# Patient Record
Sex: Female | Born: 1990 | Race: Black or African American | Hispanic: No | Smoking: Never smoker
Health system: Southern US, Community
[De-identification: ages and names within clinical notes are randomized; demographics above are authoritative.]

## PROBLEM LIST (undated history)

## (undated) DIAGNOSIS — D649 Anemia, unspecified: Secondary | ICD-10-CM

## (undated) DIAGNOSIS — F1410 Cocaine abuse, uncomplicated: Secondary | ICD-10-CM

---

## 2013-11-18 ENCOUNTER — Emergency Department: Payer: Self-pay | Admitting: Emergency Medicine

## 2013-11-18 LAB — URINALYSIS, COMPLETE
BILIRUBIN, UR: NEGATIVE
Bacteria: NONE SEEN
Glucose,UR: NEGATIVE mg/dL (ref 0–75)
KETONE: NEGATIVE
Nitrite: NEGATIVE
PH: 6 (ref 4.5–8.0)
Protein: NEGATIVE
RBC,UR: 4 /HPF (ref 0–5)
Specific Gravity: 1.027 (ref 1.003–1.030)
Squamous Epithelial: 3

## 2014-01-14 ENCOUNTER — Emergency Department (HOSPITAL_COMMUNITY)
Admission: EM | Admit: 2014-01-14 | Discharge: 2014-01-14 | Disposition: A | Discharge status: Home / Self Care | Payer: Self-pay | Attending: Emergency Medicine | Admitting: Emergency Medicine

## 2014-01-14 ENCOUNTER — Encounter (HOSPITAL_COMMUNITY): Payer: Self-pay | Admitting: Emergency Medicine

## 2014-01-14 DIAGNOSIS — Z79899 Other long term (current) drug therapy: Secondary | ICD-10-CM | POA: Insufficient documentation

## 2014-01-14 DIAGNOSIS — R1013 Epigastric pain: Secondary | ICD-10-CM | POA: Insufficient documentation

## 2014-01-14 DIAGNOSIS — Z3202 Encounter for pregnancy test, result negative: Secondary | ICD-10-CM | POA: Insufficient documentation

## 2014-01-14 DIAGNOSIS — R112 Nausea with vomiting, unspecified: Secondary | ICD-10-CM | POA: Insufficient documentation

## 2014-01-14 LAB — URINALYSIS, ROUTINE W REFLEX MICROSCOPIC
Bilirubin Urine: NEGATIVE
GLUCOSE, UA: NEGATIVE mg/dL
Hgb urine dipstick: NEGATIVE
Ketones, ur: NEGATIVE mg/dL
Leukocytes, UA: NEGATIVE
Nitrite: NEGATIVE
PH: 8.5 — AB (ref 5.0–8.0)
Protein, ur: NEGATIVE mg/dL
SPECIFIC GRAVITY, URINE: 1.023 (ref 1.005–1.030)
Urobilinogen, UA: 1 mg/dL (ref 0.0–1.0)

## 2014-01-14 LAB — COMPREHENSIVE METABOLIC PANEL
ALBUMIN: 3.6 g/dL (ref 3.5–5.2)
ALT: 10 U/L (ref 0–35)
AST: 19 U/L (ref 0–37)
Alkaline Phosphatase: 81 U/L (ref 39–117)
BILIRUBIN TOTAL: 0.4 mg/dL (ref 0.3–1.2)
BUN: 10 mg/dL (ref 6–23)
CHLORIDE: 102 meq/L (ref 96–112)
CO2: 25 mEq/L (ref 19–32)
CREATININE: 0.82 mg/dL (ref 0.50–1.10)
Calcium: 9.4 mg/dL (ref 8.4–10.5)
GFR calc Af Amer: >90 mL/min (ref >90)
GFR calc non Af Amer: >90 mL/min (ref >90)
Glucose, Bld: 81 mg/dL (ref 70–99)
Potassium: 3.9 mEq/L (ref 3.7–5.3)
Sodium: 140 mEq/L (ref 137–147)
TOTAL PROTEIN: 7.7 g/dL (ref 6.0–8.3)

## 2014-01-14 LAB — CBC WITH DIFFERENTIAL/PLATELET
BASOS ABS: 0.1 10*3/uL (ref 0.0–0.1)
BASOS PCT: 1 % (ref 0–1)
Eosinophils Absolute: 0.7 10*3/uL (ref 0.0–0.7)
Eosinophils Relative: 9 % — ABNORMAL HIGH (ref 0–5)
HEMATOCRIT: 38.7 % (ref 36.0–46.0)
Hemoglobin: 13.9 g/dL (ref 12.0–15.0)
Lymphocytes Relative: 44 % (ref 12–46)
Lymphs Abs: 3.6 10*3/uL (ref 0.7–4.0)
MCH: 28.4 pg (ref 26.0–34.0)
MCHC: 35.9 g/dL (ref 30.0–36.0)
MCV: 79 fL (ref 78.0–100.0)
MONO ABS: 0.6 10*3/uL (ref 0.1–1.0)
Monocytes Relative: 8 % (ref 3–12)
NEUTROS PCT: 38 % — AB (ref 43–77)
Neutro Abs: 3.1 10*3/uL (ref 1.7–7.7)
Platelets: 319 10*3/uL (ref 150–400)
RBC: 4.9 MIL/uL (ref 3.87–5.11)
RDW: 13.9 % (ref 11.5–15.5)
WBC: 8.1 10*3/uL (ref 4.0–10.5)

## 2014-01-14 LAB — LIPASE, BLOOD: Lipase: 36 U/L (ref 11–59)

## 2014-01-14 LAB — POC URINE PREG, ED: PREG TEST UR: NEGATIVE

## 2014-01-14 MED ORDER — HYDROCODONE-ACETAMINOPHEN 5-325 MG PO TABS: 1.0000 | ORAL_TABLET | Freq: Four times a day (QID) | ORAL | Status: DC | PRN

## 2014-01-14 MED ORDER — ONDANSETRON HCL 4 MG PO TABS: 4.0000 mg | ORAL_TABLET | Freq: Four times a day (QID) | ORAL | Status: DC

## 2014-01-14 MED ORDER — OMEPRAZOLE 20 MG PO CPDR: 20.0000 mg | DELAYED_RELEASE_CAPSULE | Freq: Every day | ORAL | Status: DC

## 2014-01-14 MED ORDER — GI COCKTAIL ~~LOC~~: 30.0000 mL | Freq: Once | ORAL | Status: DC

## 2014-01-14 NOTE — ED Provider Notes (Signed)
Medical screening examination/treatment/procedure(s) were performed by non-physician practitioner and as supervising physician I was immediately available for consultation/collaboration.   EKG Interpretation None        Melanie Belfi, MD 01/14/14 2347 

## 2014-01-14 NOTE — ED Provider Notes (Signed)
CSN: [id number]     Arrival date & time 01/14/14  1728 History   First MD Initiated Contact with Patient 01/14/14 1919     Chief Complaint  Patient presents with  . Abdominal Pain    (Consider location/radiation/quality/duration/timing/severity/associated sxs/prior Treatment) HPI Comments: Patient denies history of abdominal surgeries  Patient is a 23 y.o. female presenting with abdominal pain. The history is provided by the patient. No language interpreter was used.  Abdominal Pain Pain location:  Epigastric Pain quality: cramping, pressure and sharp   Pain radiates to:  Does not radiate Pain severity:  Moderate Onset quality:  Gradual Duration:  1 week Timing:  Constant Progression:  Waxing and waning Chronicity:  New Context: not recent illness, not recent travel, not sick contacts and not suspicious food intake   Relieved by:  None tried Worsened by:  Eating Ineffective treatments:  None tried Associated symptoms: nausea and vomiting (x 1 two days ago)   Associated symptoms: no chest pain, no constipation, no diarrhea, no dysuria, no fever, no hematemesis, no hematochezia, no hematuria, no melena and no shortness of breath   Associated symptoms comment:  +sour taste in back of throat Risk factors: has not had multiple surgeries and no recent hospitalization     History reviewed. No pertinent past medical history. History reviewed. No pertinent past surgical history. History reviewed. No pertinent family history. History  Substance Use Topics  . Smoking status: Never Smoker   . Smokeless tobacco: Not on file  . Alcohol Use: Yes     Comment: occ   OB History   Grav Para Term Preterm Abortions TAB SAB Ect Mult Living                  Review of Systems  Constitutional: Negative for fever.  Respiratory: Negative for shortness of breath.   Cardiovascular: Negative for chest pain.  Gastrointestinal: Positive for nausea, vomiting (x 1 two days ago) and abdominal pain.  Negative for diarrhea, constipation, melena, hematochezia and hematemesis.  Genitourinary: Negative for dysuria and hematuria.  All other systems reviewed and are negative.    Allergies  Peanut butter flavor and Soy allergy  Home Medications   Prior to Admission medications   Medication Sig Start Date End Date Taking? Authorizing Kenzly Rogoff  ibuprofen (ADVIL,MOTRIN) 200 MG tablet Take 400 mg by mouth every 6 (six) hours as needed.   Yes Historical Cataleyah Colborn, MD  HYDROcodone-acetaminophen (NORCO/VICODIN) 5-325 MG per tablet Take 1 tablet by mouth every 6 (six) hours as needed for moderate pain or severe pain. 01/14/14   Antony MaduraKelly Humes, PA-C  omeprazole (PRILOSEC) 20 MG capsule Take 1 capsule (20 mg total) by mouth daily. 01/14/14   Antony MaduraKelly Humes, PA-C  ondansetron (ZOFRAN) 4 MG tablet Take 1 tablet (4 mg total) by mouth every 6 (six) hours. 01/14/14   Antony MaduraKelly Humes, PA-C   BP 104/65  Pulse 72  Temp(Src) 98.2 F (36.8 C) (Oral)  Resp 17  Ht 5' 2.25" (1.581 m)  Wt 135 lb (61.236 kg)  BMI 24.50 kg/m2  SpO2 100%  LMP 01/08/2014  Physical Exam  Nursing note and vitals reviewed. Constitutional: She is oriented to person, place, and time. She appears well-developed and well-nourished. No distress.  Nontoxic/nonseptic appearing  HENT:  Head: Normocephalic and atraumatic.  Mouth/Throat: Oropharynx is clear and moist. No oropharyngeal exudate.  Eyes: Conjunctivae and EOM are normal. No scleral icterus.  Neck: Normal range of motion.  Cardiovascular: Normal rate, regular rhythm and normal heart sounds.  Pulmonary/Chest: Effort normal and breath sounds normal. No respiratory distress. She has no wheezes. She has no rales.  Chest expansion symmetric  Abdominal: Soft. She exhibits no distension and no mass. There is tenderness (epigastric). There is no rebound and no guarding.  Negative Murphy sign. No peritoneal signs.  Musculoskeletal: Normal range of motion.  Neurological: She is alert and  oriented to person, place, and time. Coordination normal.  GCS 15. Patient moves extremities without ataxia.  Skin: Skin is warm and dry. No rash noted. She is not diaphoretic. No erythema. No pallor.  Psychiatric: She has a normal mood and affect. Her behavior is normal.    ED Course  Procedures (including critical care time) Labs Review Labs Reviewed  CBC WITH DIFFERENTIAL - Abnormal; Notable for the following:    Neutrophils Relative % 38 (*)    Eosinophils Relative 9 (*)    All other components within normal limits  URINALYSIS, ROUTINE W REFLEX MICROSCOPIC - Abnormal; Notable for the following:    pH 8.5 (*)    All other components within normal limits  COMPREHENSIVE METABOLIC PANEL  LIPASE, BLOOD  POC URINE PREG, ED    Imaging Review No results found.   EKG Interpretation None      MDM   Final diagnoses:  Epigastric pain    Patient is a [age] year old female with a history of reflux during pregnancy who presents to the emergency department for epigastric abdominal pain with emesis x1 two days ago. She endorses normal regular bowel movements and denies fever. Physical exam significant for a mild tenderness to deep palpation in the epigastric region with negative Murphy's sign. No peritoneal signs or abdominal distention. No masses.   Labs today reviewed which show no leukocytosis, electrolyte imbalance, or anemia. Liver and kidney function preserved. Given reassuring labs and hemodynamic stability along with relatively benign abdominal examination, do not believe further emergent workup is indicated at this time. Doubt bowel obstruction given lack of distention and normal bowel movements. Doubt acute cholecystitis given lack of fever, leukocytosis, and elevated LFTs. Lipase normal without evidence of pancreatitis.   Patient will be discharged with restriction for Prilosec and Zofran for likely reflux symptoms. Short course of Norco given for pain control as needed. Return  precautions provided and patient agreeable to plan with no unaddressed concerns. Patient discharged in good condition.   Filed Vitals:   01/14/14 1900 01/14/14 1915 01/14/14 1930 01/14/14 2000  BP: 129/84 113/56 114/56 104/65  Pulse: 89 74 74 72  Temp:    98.2 F (36.8 C)  TempSrc:    Oral  Resp: 25 19 17 17   Height:      Weight:      SpO2: 98% 98% 99% 100%      Antony MaduraKelly Humes, PA-C 01/14/14 2301

## 2014-01-14 NOTE — ED Notes (Signed)
Pt reports pain across mid abd and right side of abd x 1 week, had n/v x 1. Denies diarrhea.

## 2014-01-14 NOTE — ED Notes (Signed)
Pt unable to urinate and obtain urine sample at triage. 

## 2014-01-14 NOTE — ED Notes (Signed)
Patient refused a wheelchair.

## 2014-01-14 NOTE — Discharge Instructions (Signed)

## 2014-01-14 NOTE — ED Notes (Signed)
Tresa EndoKelly, Pa at the bedside.

## 2014-06-14 ENCOUNTER — Emergency Department (HOSPITAL_COMMUNITY)
Admission: EM | Admit: 2014-06-14 | Discharge: 2014-06-14 | Disposition: A | Discharge status: Home / Self Care | Payer: Self-pay | Attending: Emergency Medicine | Admitting: Emergency Medicine

## 2014-06-14 ENCOUNTER — Encounter (HOSPITAL_COMMUNITY): Payer: Self-pay | Admitting: Emergency Medicine

## 2014-06-14 DIAGNOSIS — B349 Viral infection, unspecified: Secondary | ICD-10-CM | POA: Insufficient documentation

## 2014-06-14 DIAGNOSIS — Z3202 Encounter for pregnancy test, result negative: Secondary | ICD-10-CM | POA: Insufficient documentation

## 2014-06-14 DIAGNOSIS — Z862 Personal history of diseases of the blood and blood-forming organs and certain disorders involving the immune mechanism: Secondary | ICD-10-CM | POA: Insufficient documentation

## 2014-06-14 DIAGNOSIS — Z79899 Other long term (current) drug therapy: Secondary | ICD-10-CM | POA: Insufficient documentation

## 2014-06-14 DIAGNOSIS — N898 Other specified noninflammatory disorders of vagina: Secondary | ICD-10-CM | POA: Insufficient documentation

## 2014-06-14 HISTORY — DX: Anemia, unspecified: D64.9

## 2014-06-14 LAB — WET PREP, GENITAL
Clue Cells Wet Prep HPF POC: NONE SEEN
Trich, Wet Prep: NONE SEEN
YEAST WET PREP: NONE SEEN

## 2014-06-14 LAB — CBC WITH DIFFERENTIAL/PLATELET
Basophils Absolute: 0 10*3/uL (ref 0.0–0.1)
Basophils Relative: 0 % (ref 0–1)
Eosinophils Absolute: 0.5 10*3/uL (ref 0.0–0.7)
Eosinophils Relative: 7 % — ABNORMAL HIGH (ref 0–5)
HEMATOCRIT: 37.5 % (ref 36.0–46.0)
Hemoglobin: 13.5 g/dL (ref 12.0–15.0)
LYMPHS ABS: 2.8 10*3/uL (ref 0.7–4.0)
LYMPHS PCT: 38 % (ref 12–46)
MCH: 28.5 pg (ref 26.0–34.0)
MCHC: 36 g/dL (ref 30.0–36.0)
MCV: 79.1 fL (ref 78.0–100.0)
MONO ABS: 0.7 10*3/uL (ref 0.1–1.0)
Monocytes Relative: 9 % (ref 3–12)
Neutro Abs: 3.5 10*3/uL (ref 1.7–7.7)
Neutrophils Relative %: 46 % (ref 43–77)
Platelets: 293 10*3/uL (ref 150–400)
RBC: 4.74 MIL/uL (ref 3.87–5.11)
RDW: 13.8 % (ref 11.5–15.5)
WBC: 7.5 10*3/uL (ref 4.0–10.5)

## 2014-06-14 LAB — COMPREHENSIVE METABOLIC PANEL
ALT: 9 U/L (ref 0–35)
AST: 14 U/L (ref 0–37)
Albumin: 3.2 g/dL — ABNORMAL LOW (ref 3.5–5.2)
Alkaline Phosphatase: 65 U/L (ref 39–117)
Anion gap: 11 (ref 5–15)
BILIRUBIN TOTAL: 0.5 mg/dL (ref 0.3–1.2)
BUN: 11 mg/dL (ref 6–23)
CALCIUM: 9.4 mg/dL (ref 8.4–10.5)
CHLORIDE: 99 meq/L (ref 96–112)
CO2: 24 meq/L (ref 19–32)
Creatinine, Ser: 0.7 mg/dL (ref 0.50–1.10)
GFR calc Af Amer: >90 mL/min (ref >90)
Glucose, Bld: 98 mg/dL (ref 70–99)
Potassium: 3.7 mEq/L (ref 3.7–5.3)
SODIUM: 134 meq/L — AB (ref 137–147)
Total Protein: 7 g/dL (ref 6.0–8.3)

## 2014-06-14 LAB — URINALYSIS, ROUTINE W REFLEX MICROSCOPIC
Bilirubin Urine: NEGATIVE
Glucose, UA: NEGATIVE mg/dL
Ketones, ur: NEGATIVE mg/dL
Leukocytes, UA: NEGATIVE
NITRITE: NEGATIVE
PROTEIN: NEGATIVE mg/dL
SPECIFIC GRAVITY, URINE: 1.011 (ref 1.005–1.030)
Urobilinogen, UA: 2 mg/dL — ABNORMAL HIGH (ref 0.0–1.0)
pH: 7 (ref 5.0–8.0)

## 2014-06-14 LAB — LIPASE, BLOOD: LIPASE: 21 U/L (ref 11–59)

## 2014-06-14 LAB — PREGNANCY, URINE: Preg Test, Ur: NEGATIVE

## 2014-06-14 LAB — URINE MICROSCOPIC-ADD ON

## 2014-06-14 MED ORDER — ONDANSETRON 4 MG PO TBDP
4.0000 mg | ORAL_TABLET | Freq: Once | ORAL | Status: DC
  Filled 2014-06-14: qty 1

## 2014-06-14 MED ORDER — BENZONATATE 100 MG PO CAPS: 100.0000 mg | ORAL_CAPSULE | Freq: Three times a day (TID) | ORAL | Status: DC

## 2014-06-14 MED ORDER — ACETAMINOPHEN 325 MG PO TABS
650.0000 mg | ORAL_TABLET | Freq: Once | ORAL | Status: AC
  Administered 2014-06-14: 650 mg via ORAL
  Filled 2014-06-14: qty 2

## 2014-06-14 MED ORDER — TRAMADOL HCL 50 MG PO TABS: 50.0000 mg | ORAL_TABLET | Freq: Four times a day (QID) | ORAL | Status: DC | PRN

## 2014-06-14 NOTE — ED Notes (Signed)
Pt states she has had cold symptoms for the past two weeks  Pt c/o nasal and chest congestion with productive cough and body aches  Pt is c/o headache  Pt states she has been nauseated  Pt states she also had an episode where she was passing blood tinged mucous vaginally  Pt states her last normal period was around [employer]

## 2014-06-14 NOTE — ED Provider Notes (Signed)
CSN: [id number]     Arrival date & time 06/14/14  1918 History   First MD Initiated Contact with Patient 06/14/14 1924     Chief Complaint  Patient presents with  . URI  . Headache     (Consider location/radiation/quality/duration/timing/severity/associated sxs/prior Treatment) HPI   Patient to the ER for multiple complaints. She has been having URI symptoms (cough, congestion, runny nose, body aches), headache, nausea, vaginal passage of mucous and spotting of blood without pelvic pain. She has not had any weakness, dysuria, weakness, confusion, and neck pain. She is sexually active but does not use any protection or birth control.  Past Medical History  Diagnosis Date  . Anemia    History reviewed. No pertinent past surgical history. Family History  Problem Relation Age of Onset  . Cancer Mother   . Hypertension Father   . Diabetes Other   . Heart attack Other    History  Substance Use Topics  . Smoking status: Never Smoker   . Smokeless tobacco: Not on file  . Alcohol Use: Yes     Comment: occ   OB History    No data available     Review of Systems 10 Systems reviewed and are negative for acute change except as noted in the HPI.   Allergies  Peanut butter flavor and Soy allergy  Home Medications   Prior to Admission medications   Medication Sig Start Date End Date Taking? Authorizing Provider  ibuprofen (ADVIL,MOTRIN) 200 MG tablet Take 400 mg by mouth every 8 (eight) hours as needed for headache or moderate pain (headache & pain).    Yes Historical Provider, MD  benzonatate (TESSALON) 100 MG capsule Take 1 capsule (100 mg total) by mouth every 8 (eight) hours. 06/14/14   Peighton Mehra Irine SealG Nickson Middlesworth, PA-C  HYDROcodone-acetaminophen (NORCO/VICODIN) 5-325 MG per tablet Take 1 tablet by mouth every 6 (six) hours as needed for moderate pain or severe pain. 01/14/14   Antony MaduraKelly Humes, PA-C  omeprazole (PRILOSEC) 20 MG capsule Take 1 capsule (20 mg total) by mouth daily. Patient not  taking: Reported on 06/14/2014 01/14/14   Antony MaduraKelly Humes, PA-C  ondansetron (ZOFRAN) 4 MG tablet Take 1 tablet (4 mg total) by mouth every 6 (six) hours. 01/14/14   Antony MaduraKelly Humes, PA-C  traMADol (ULTRAM) 50 MG tablet Take 1 tablet (50 mg total) by mouth every 6 (six) hours as needed. 06/14/14   Katrinna Travieso Irine SealG Callan Norden, PA-C   BP 101/57 mmHg  Pulse 68  Temp(Src) 97.8 F (36.6 C) (Oral)  Resp 20  SpO2 100%  LMP 05/13/2014 (Approximate) Physical Exam  Constitutional: She appears well-developed and well-nourished. No distress.  HENT:  Head: Normocephalic and atraumatic.  Right Ear: Tympanic membrane and ear canal normal.  Left Ear: Tympanic membrane and ear canal normal.  Nose: Nose normal.  Mouth/Throat: Uvula is midline, oropharynx is clear and moist and mucous membranes are normal.  Eyes: Pupils are equal, round, and reactive to light.  Neck: Normal range of motion. Neck supple.  Cardiovascular: Normal rate and regular rhythm.   Pulmonary/Chest: Effort normal and breath sounds normal. She has no decreased breath sounds. She has no wheezes. She has no rhonchi. She exhibits no retraction.  Abdominal: Soft. Bowel sounds are normal. She exhibits no distension and no fluid wave. There is no tenderness. There is no rebound and no guarding.  Genitourinary: Uterus normal. Cervix exhibits no motion tenderness and no discharge. Right adnexum displays no mass, no tenderness and no fullness. Left adnexum displays  no mass, no tenderness and no fullness. There is bleeding (small amount of blood in vaginal vault) in the vagina. Vaginal discharge (clear mucous) found.  Neurological: She is alert.  Skin: Skin is warm and dry.  Nursing note and vitals reviewed.     ED Course  Procedures (including critical care time) Labs Review Labs Reviewed  WET PREP, GENITAL - Abnormal; Notable for the following:    WBC, Wet Prep HPF POC FEW (*)    All other components within normal limits  URINALYSIS, ROUTINE W REFLEX  MICROSCOPIC - Abnormal; Notable for the following:    Hgb urine dipstick MODERATE (*)    Urobilinogen, UA 2.0 (*)    All other components within normal limits  COMPREHENSIVE METABOLIC PANEL - Abnormal; Notable for the following:    Sodium 134 (*)    Albumin 3.2 (*)    All other components within normal limits  CBC WITH DIFFERENTIAL - Abnormal; Notable for the following:    Eosinophils Relative 7 (*)    All other components within normal limits  GC/CHLAMYDIA PROBE AMP  PREGNANCY, URINE  URINE MICROSCOPIC-ADD ON  LIPASE, BLOOD    Imaging Review No results found.   EKG Interpretation None      MDM   Final diagnoses:  Viral syndrome    Medications  ondansetron (ZOFRAN-ODT) disintegrating tablet 4 mg (4 mg Oral Not Given 06/14/14 2209)  acetaminophen (TYLENOL) tablet 650 mg (650 mg Oral Given 06/14/14 2022)    Patient has had thorough work-up in the ED which has been unremarkable. Her symptoms are most likely viral and I recommend she be treated symptomatically. Her headache did improve with the Tylenol.  Rx: Tessalon Perls and Ultram.  23 y.o.Nancy Proctor's evaluation in the Emergency Department is complete. It has been determined that no acute conditions requiring further emergency intervention are present at this time. The patient/guardian have been advised of the diagnosis and plan. We have discussed signs and symptoms that warrant return to the ED, such as changes or worsening in symptoms.  Vital signs are stable at discharge. Filed Vitals:   06/14/14 2149  BP: 101/57  Pulse: 68  Temp:   Resp: 20    Patient/guardian has voiced understanding and agreed to follow-up with the PCP or specialist.     Nancy Matasiffany G Sharnelle Cappelli, PA-C 06/14/14 2212  Audree CamelScott T Goldston, MD 06/14/14 2247

## 2014-06-14 NOTE — Discharge Instructions (Signed)

## 2014-06-16 LAB — GC/CHLAMYDIA PROBE AMP
CT Probe RNA: POSITIVE — AB
GC Probe RNA: POSITIVE — AB

## 2014-06-17 ENCOUNTER — Telehealth: Payer: Self-pay | Admitting: Emergency Medicine

## 2014-06-17 NOTE — Telephone Encounter (Signed)
Positive Chlamydia culture Positive Gonorrhea culture Chart sent to EDP for review 

## 2014-06-19 ENCOUNTER — Telehealth (HOSPITAL_COMMUNITY): Payer: Self-pay

## 2014-06-19 NOTE — ED Notes (Signed)
Attempted to contact. Unable to reach by telephone. Letter sent to address on record.  Pt positive for Gonorrhea and Chlamydia. Needs to return for treatment per MD

## 2014-07-06 ENCOUNTER — Emergency Department: Payer: Self-pay | Admitting: Emergency Medicine

## 2015-01-22 ENCOUNTER — Encounter (HOSPITAL_COMMUNITY): Payer: Self-pay | Admitting: Emergency Medicine

## 2015-01-22 ENCOUNTER — Emergency Department (HOSPITAL_COMMUNITY)
Admission: EM | Admit: 2015-01-22 | Discharge: 2015-01-24 | Disposition: A | Discharge status: Home / Self Care | Payer: Medicaid Other | Attending: Emergency Medicine | Admitting: Emergency Medicine

## 2015-01-22 DIAGNOSIS — Z3202 Encounter for pregnancy test, result negative: Secondary | ICD-10-CM | POA: Diagnosis not present

## 2015-01-22 DIAGNOSIS — F69 Unspecified disorder of adult personality and behavior: Secondary | ICD-10-CM | POA: Diagnosis present

## 2015-01-22 DIAGNOSIS — Z862 Personal history of diseases of the blood and blood-forming organs and certain disorders involving the immune mechanism: Secondary | ICD-10-CM | POA: Diagnosis not present

## 2015-01-22 DIAGNOSIS — F1410 Cocaine abuse, uncomplicated: Secondary | ICD-10-CM | POA: Insufficient documentation

## 2015-01-22 DIAGNOSIS — F29 Unspecified psychosis not due to a substance or known physiological condition: Secondary | ICD-10-CM | POA: Diagnosis not present

## 2015-01-22 HISTORY — DX: Cocaine abuse, uncomplicated: F14.10

## 2015-01-22 LAB — RAPID URINE DRUG SCREEN, HOSP PERFORMED
AMPHETAMINES: NOT DETECTED
BARBITURATES: NOT DETECTED
BENZODIAZEPINES: NOT DETECTED
Cocaine: POSITIVE — AB
Opiates: NOT DETECTED
Tetrahydrocannabinol: NOT DETECTED

## 2015-01-22 LAB — CBC
HEMATOCRIT: 38.2 % (ref 36.0–46.0)
HEMOGLOBIN: 13.7 g/dL (ref 12.0–15.0)
MCH: 28.5 pg (ref 26.0–34.0)
MCHC: 35.9 g/dL (ref 30.0–36.0)
MCV: 79.4 fL (ref 78.0–100.0)
Platelets: 336 10*3/uL (ref 150–400)
RBC: 4.81 MIL/uL (ref 3.87–5.11)
RDW: 13.2 % (ref 11.5–15.5)
WBC: 9.3 10*3/uL (ref 4.0–10.5)

## 2015-01-22 LAB — COMPREHENSIVE METABOLIC PANEL
ALK PHOS: 54 U/L (ref 38–126)
ALT: 23 U/L (ref 14–54)
AST: 20 U/L (ref 15–41)
Albumin: 4.4 g/dL (ref 3.5–5.0)
Anion gap: 14 (ref 5–15)
BILIRUBIN TOTAL: 1.5 mg/dL — AB (ref 0.3–1.2)
BUN: 12 mg/dL (ref 6–20)
CO2: 20 mmol/L — AB (ref 22–32)
CREATININE: 1 mg/dL (ref 0.44–1.00)
Calcium: 9.3 mg/dL (ref 8.9–10.3)
Chloride: 104 mmol/L (ref 101–111)
GFR calc Af Amer: >60 mL/min (ref >60)
Glucose, Bld: 89 mg/dL (ref 65–99)
Potassium: 3.3 mmol/L — ABNORMAL LOW (ref 3.5–5.1)
SODIUM: 138 mmol/L (ref 135–145)
Total Protein: 8.1 g/dL (ref 6.5–8.1)

## 2015-01-22 LAB — I-STAT BETA HCG BLOOD, ED (MC, WL, AP ONLY): I-stat hCG, quantitative: <5 m[IU]/mL (ref <5)

## 2015-01-22 LAB — SALICYLATE LEVEL: Salicylate Lvl: <4 mg/dL (ref 2.8–30.0)

## 2015-01-22 LAB — ETHANOL: Alcohol, Ethyl (B): <5 mg/dL (ref <5)

## 2015-01-22 LAB — ACETAMINOPHEN LEVEL

## 2015-01-22 MED ORDER — LORAZEPAM 2 MG/ML IJ SOLN
1.0000 mg | Freq: Once | INTRAMUSCULAR | Status: AC
  Administered 2015-01-22: 1 mg via INTRAMUSCULAR
  Filled 2015-01-22: qty 1
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
## 2015-01-22 NOTE — ED Notes (Signed)
Family says they will be waiting in the lobby.

## 2015-01-22 NOTE — Progress Notes (Signed)
Patient listed as having Medicaid [address] [employer] [place].  PCP listed on patient's insurance card [facility]  [phone].  System updated.
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
## 2015-01-22 NOTE — ED Notes (Signed)
Patient is awake, asking for phone. Notified MD that patient is awake.

## 2015-01-22 NOTE — ED Notes (Signed)
SALLY WHITEHEAD (FAMILY FRIEND) called and left her number for pt to call for a ride upon discharge [phone]
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
## 2015-01-23 DIAGNOSIS — F1410 Cocaine abuse, uncomplicated: Secondary | ICD-10-CM

## 2015-01-23 MED ORDER — LORAZEPAM 0.5 MG PO TABS
0.5000 mg | ORAL_TABLET | Freq: Three times a day (TID) | ORAL | Status: DC | PRN
  Administered 2015-01-23: 0.5 mg via ORAL
  Filled 2015-01-23: qty 1
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
## 2015-01-23 NOTE — ED Notes (Signed)
Tele Psychiatric Assessment at this time.
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
## 2015-01-23 NOTE — ED Notes (Signed)
MD at bedside. 

## 2015-01-23 NOTE — Progress Notes (Signed)
CSW met with pt and CPS worker Barry Dienes of DSS at bedside. CPS worker came to have a meeting to assess pt and set up a future meeting time for the patient to discuss her child custody case.  CPS worker informed patient that her meeting to discuss placement for her son and meet the CPS supervisor will be  Friday at 9:00am. The pt was told by CPS worker that she could have her support system at the meeting. Patient states that she plans to attend the meeting.  Willette Brace [phone] ED CSW 01/23/2015 4:56 PM

## 2015-01-24 DIAGNOSIS — F1410 Cocaine abuse, uncomplicated: Secondary | ICD-10-CM | POA: Insufficient documentation

## 2015-01-24 DIAGNOSIS — F29 Unspecified psychosis not due to a substance or known physiological condition: Secondary | ICD-10-CM | POA: Insufficient documentation

## 2015-01-24 NOTE — ED Notes (Signed)
Patient denies SI, HI and AVH at this time. Plan of care dicussed with patient. Patient given soda at this time. Encouragement and support provided and safety maintain. Encouragement and support provided and safety maintain. Q 15 min safety checks remain in place.

## 2015-01-24 NOTE — ED Notes (Signed)
Patient discharged to home.  Denies any thoughts of harm to self or others.  She also denies having any auditory or visual hallucinations.  All belongings returned and signed for.  She left the unit ambulatory with all of her belongings.
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
## 2015-01-24 NOTE — [facility] Suicide Risk Assessment (Cosign Needed)
Suicide Risk Assessment  Discharge Assessment   [facility]   Demographic Factors:  Adolescent or young adult, Low socioeconomic status and Unemployed  Total Time spent with patient: 20 minutes  Musculoskeletal: Strength & Muscle Tone: within normal limits Gait & Station: normal Patient leans: N/A  Psychiatric Specialty Exam:     Blood pressure 110/58, pulse 68, temperature 97.8 F (36.6 C), temperature source Oral, resp. rate 18, last menstrual period 12/19/2014, SpO2 100 %.There is no weight on file to calculate BMI.  General Appearance: Casual  Eye Contact::  Good  Speech:  Clear and Coherent  Volume:  Normal  Mood:  Anxious  Affect:  Congruent  Thought Process:  Coherent  Orientation:  Full (Time, Place, and Person)  Thought Content:  Negative  Suicidal Thoughts:  No  Homicidal Thoughts:  No  Memory:  Immediate;   Fair Recent;   Fair Remote;   Good  Judgement:  Fair  Insight:  Fair  Psychomotor Activity:  Normal  Concentration:  Good  Recall:  Poor  Fund of Knowledge:Good  Language: Good  Akathisia:  Negative  Handed:  Right  AIMS (if indicated):     Assets:  Communication Skills Desire for Improvement Housing Physical Health Social Support  ADL's:  Intact  Cognition: WNL  Sleep:           Has this patient used any form of tobacco in the last 30 days? (Cigarettes, Smokeless Tobacco, Cigars, and/or Pipes) N/A  Mental Status Per Nursing Assessment::   On Admission:     Current Mental Status by Physician: NA  Loss Factors: NA  Historical Factors: NA  Risk Reduction Factors:   Responsible for children under 23 years of age and Positive therapeutic relationship  Continued Clinical Symptoms:  Severe Anxiety and/or Agitation Alcohol/Substance Abuse/Dependencies  Cognitive Features That Contribute To Risk:  Polarized thinking    Suicide Risk:  Minimal: No identifiable suicidal ideation.  Patients presenting with no  risk factors but with morbid ruminations; may be classified as minimal risk based on the severity of the depressive symptoms  Principal Problem: <principal problem not specified> Discharge Diagnoses: There are no active problems to display for this patient.     Plan Of Care/Follow-up recommendations:  Activity:  as tolerated Diet:  Regular  Is patient on multiple antipsychotic therapies at discharge:  No   Has Patient had three or more failed trials of antipsychotic monotherapy by history:  No  Recommended Plan for Multiple Antipsychotic Therapies: NA    Amous Crewe C    PMHNP-BC 01/24/2015, 11:32 AM

## 2015-01-24 NOTE — BH Assessment (Signed)
[facility] Assessment Progress Note   Per Carolanne GrumblingGerald Taylor, MD, this pt does not require psychiatric hospitalization at this time.  [facility]  [facility] been included in her discharge instructions.  Pt's nurse has been notified.  Doylene Canninghomas Joshia Kitchings, MA Triage Specialist [phone]

## 2015-01-24 NOTE — Discharge Instructions (Signed)
For your ongoing behavioral health needs you are advised to follow up with [facility].  New patients are seen at their walk-in clinic.  Walk-in hours are Monday - Friday from 8:00 am - 12:00 pm, and from 1:00 pm - 3:00 pm.  Walk-in patients are seen on a first come, first served basis, so try to arrive as early as possible for the best chance of being seen the same day.  There is an initial fee of $22.50: ° °     [facility] °     [address] °     [place], [place] [address] °     [phone] °

## 2015-05-10 ENCOUNTER — Emergency Department (HOSPITAL_COMMUNITY)
Admission: EM | Admit: 2015-05-10 | Discharge: 2015-05-10 | Disposition: A | Discharge status: Home / Self Care | Payer: Medicaid Other | Attending: Emergency Medicine | Admitting: Emergency Medicine

## 2015-05-10 ENCOUNTER — Encounter (HOSPITAL_COMMUNITY): Payer: Self-pay | Admitting: *Deleted

## 2015-05-10 DIAGNOSIS — K047 Periapical abscess without sinus: Secondary | ICD-10-CM | POA: Insufficient documentation

## 2015-05-10 DIAGNOSIS — R600 Localized edema: Secondary | ICD-10-CM | POA: Diagnosis present

## 2015-05-10 DIAGNOSIS — Z862 Personal history of diseases of the blood and blood-forming organs and certain disorders involving the immune mechanism: Secondary | ICD-10-CM | POA: Diagnosis not present

## 2015-05-10 MED ORDER — CLINDAMYCIN HCL 150 MG PO CAPS
600.0000 mg | ORAL_CAPSULE | Freq: Once | ORAL | Status: AC
Start: 2015-05-10 — End: 2015-05-10
  Administered 2015-05-10: 600 mg via ORAL
  Filled 2015-05-10: qty 4

## 2015-05-10 MED ORDER — IBUPROFEN 400 MG PO TABS
800.0000 mg | ORAL_TABLET | Freq: Once | ORAL | Status: AC
  Administered 2015-05-10: 800 mg via ORAL
  Filled 2015-05-10: qty 2

## 2015-05-10 MED ORDER — CLINDAMYCIN HCL 150 MG PO CAPS: 450.0000 mg | ORAL_CAPSULE | Freq: Three times a day (TID) | ORAL | Status: AC

## 2015-05-10 NOTE — Discharge Instructions (Signed)
Please take medication as prescribed. Follow up with dentistry. Return if pain or swelling worsens or for any additional concerns

## 2015-05-10 NOTE — ED Notes (Signed)
Patient states "I feel pain, but it's numb.  I don't feel anything".  Asked patient to clarify, she responded with same.

## 2015-05-10 NOTE — ED Notes (Signed)
Pt is here with left lower jaw swelling which started this am when she woke up.  No sob.

## 2015-05-10 NOTE — ED Provider Notes (Signed)
CSN: [id number]     Arrival date & time 05/10/15  1215 History   First MD Initiated Contact with Patient 05/10/15 1246     Chief Complaint  Patient presents with  . Facial Swelling     (Consider location/radiation/quality/duration/timing/severity/associated sxs/prior Treatment) The history is provided by the patient and medical records. No language interpreter was used.  Nancy Proctor is a 24 yo AAF who presents complaining of left-sided facial swelling that was first noticed when she woke up this morning (approximately 9am). Pain is described as throbbing 8/10. She states no worsening or alleviating factors.  No radiation of the pain.   Past Medical History  Diagnosis Date  . Anemia   . Cocaine abuse    History reviewed. No pertinent past surgical history. Family History  Problem Relation Age of Onset  . Cancer Mother   . Hypertension Father   . Diabetes Other   . Heart attack Other    Social History  Substance Use Topics  . Smoking status: Never Smoker   . Smokeless tobacco: None  . Alcohol Use: Yes     Comment: occ   OB History    No data available     Review of Systems  Constitutional: Negative.   HENT: Positive for facial swelling. Negative for congestion, ear discharge, ear pain, mouth sores, postnasal drip, rhinorrhea, sinus pressure, sneezing, sore throat and trouble swallowing.   Respiratory: Negative for cough, shortness of breath and wheezing.   Cardiovascular: Negative.   Gastrointestinal: Negative for nausea, vomiting, abdominal pain, diarrhea and constipation.      Allergies  Peanut butter flavor and Soy allergy  Home Medications   Prior to Admission medications   Medication Sig Start Date End Date Taking? Authorizing Provider  ibuprofen (ADVIL,MOTRIN) 200 MG tablet Take 400 mg by mouth every 8 (eight) hours as needed for headache or moderate pain (headache & pain).     Historical Provider, MD   BP 136/85 mmHg  Pulse 87  Temp(Src) 98.6 F (37  C) (Oral)  Resp 18  SpO2 100% Physical Exam  Constitutional: She is oriented to person, place, and time. She appears well-developed and well-nourished.  HENT:  Head:    Mouth/Throat: Oropharynx is clear and moist. No oral lesions. Abnormal dentition.    Left-sided facial swelling.   Tenderness to palpation of cheek as depicted in image - no tenderness of soft tissue of mandible or neck.   Discoloration of left bottom molar as depicted in the image. Mild erythema of the gingiva surrounding this tooth. Small amount of fluctuance under tooth. Very tender when touched.   Neck: Normal range of motion. Neck supple. No tracheal deviation present.  Cardiovascular: Normal rate, regular rhythm, normal heart sounds and intact distal pulses.  Exam reveals no gallop and no friction rub.   No murmur heard. Pulmonary/Chest: Effort normal and breath sounds normal. No respiratory distress. She has no wheezes. She has no rales. She exhibits no tenderness.  Abdominal: Soft. Bowel sounds are normal. She exhibits no distension and no mass. There is no tenderness. There is no rebound and no guarding.  Musculoskeletal: Normal range of motion.  Neurological: She is alert and oriented to person, place, and time.  Skin: Skin is warm and dry.  Psychiatric: She has a normal mood and affect. Her behavior is normal. Judgment and thought content normal.    ED Course  Procedures (including critical care time) Labs Review Labs Reviewed - No data to display  Imaging Review No  results found. I have personally reviewed and evaluated these images and lab results as part of my medical decision-making.   EKG Interpretation None      MDM   Final diagnoses:  None   Nancy Proctor presents with facial swelling. On exam, she is noted to have discoloration and tenderness of bottom left molar. Small amount of fluctuance on bottom left gingiva. No shortness of breath, signs of infection spreading. Patient to be  discharged with prescription for Clinda and resources to follow up with dentistry.     [facility] Pil[facility] Laneisha Mino, PA-C 05/10/15 1337  Jerelyn ScottMartha Linker, MD 05/10/15 [phone]

## 2015-06-10 ENCOUNTER — Emergency Department (HOSPITAL_COMMUNITY): Payer: Medicaid Other

## 2015-06-10 ENCOUNTER — Encounter (HOSPITAL_COMMUNITY): Payer: Self-pay | Admitting: Emergency Medicine

## 2015-06-10 ENCOUNTER — Inpatient Hospital Stay (HOSPITAL_COMMUNITY)
Admission: EM | Admit: 2015-06-10 | Discharge: 2015-06-15 | DRG: 871 | Disposition: A | Discharge status: Home / Self Care | Payer: Medicaid Other | Attending: Internal Medicine | Admitting: Internal Medicine

## 2015-06-10 DIAGNOSIS — R443 Hallucinations, unspecified: Secondary | ICD-10-CM

## 2015-06-10 DIAGNOSIS — F23 Brief psychotic disorder: Secondary | ICD-10-CM

## 2015-06-10 DIAGNOSIS — J189 Pneumonia, unspecified organism: Secondary | ICD-10-CM

## 2015-06-10 DIAGNOSIS — F29 Unspecified psychosis not due to a substance or known physiological condition: Secondary | ICD-10-CM | POA: Diagnosis present

## 2015-06-10 DIAGNOSIS — A419 Sepsis, unspecified organism: Secondary | ICD-10-CM | POA: Diagnosis present

## 2015-06-10 DIAGNOSIS — Z91018 Allergy to other foods: Secondary | ICD-10-CM

## 2015-06-10 DIAGNOSIS — Z91010 Allergy to peanuts: Secondary | ICD-10-CM

## 2015-06-10 DIAGNOSIS — E876 Hypokalemia: Secondary | ICD-10-CM | POA: Diagnosis present

## 2015-06-10 DIAGNOSIS — R059 Cough, unspecified: Secondary | ICD-10-CM

## 2015-06-10 DIAGNOSIS — R05 Cough: Secondary | ICD-10-CM

## 2015-06-10 DIAGNOSIS — J690 Pneumonitis due to inhalation of food and vomit: Secondary | ICD-10-CM | POA: Diagnosis present

## 2015-06-10 DIAGNOSIS — J982 Interstitial emphysema: Secondary | ICD-10-CM

## 2015-06-10 DIAGNOSIS — F1410 Cocaine abuse, uncomplicated: Secondary | ICD-10-CM | POA: Diagnosis present

## 2015-06-10 LAB — COMPREHENSIVE METABOLIC PANEL WITH GFR
ALT: 58 U/L — ABNORMAL HIGH (ref 14–54)
AST: 173 U/L — ABNORMAL HIGH (ref 15–41)
Albumin: 4.6 g/dL (ref 3.5–5.0)
Alkaline Phosphatase: 61 U/L (ref 38–126)
Anion gap: 16 — ABNORMAL HIGH (ref 5–15)
BUN: 23 mg/dL — ABNORMAL HIGH (ref 6–20)
CO2: 20 mmol/L — ABNORMAL LOW (ref 22–32)
Calcium: 9.7 mg/dL (ref 8.9–10.3)
Chloride: 102 mmol/L (ref 101–111)
Creatinine, Ser: 1.17 mg/dL — ABNORMAL HIGH (ref 0.44–1.00)
GFR calc Af Amer: >60 mL/min
GFR calc non Af Amer: >60 mL/min
Glucose, Bld: 76 mg/dL (ref 65–99)
Potassium: 3.6 mmol/L (ref 3.5–5.1)
Sodium: 138 mmol/L (ref 135–145)
Total Bilirubin: 1.7 mg/dL — ABNORMAL HIGH (ref 0.3–1.2)
Total Protein: 8.2 g/dL — ABNORMAL HIGH (ref 6.5–8.1)

## 2015-06-10 LAB — I-STAT BETA HCG BLOOD, ED (MC, WL, AP ONLY): I-stat hCG, quantitative: <5 m[IU]/mL (ref <5)

## 2015-06-10 LAB — CBC
HCT: 41.1 % (ref 36.0–46.0)
Hemoglobin: 14.8 g/dL (ref 12.0–15.0)
MCH: 29.2 pg (ref 26.0–34.0)
MCHC: 36 g/dL (ref 30.0–36.0)
MCV: 81.1 fL (ref 78.0–100.0)
Platelets: 324 K/uL (ref 150–400)
RBC: 5.07 MIL/uL (ref 3.87–5.11)
RDW: 13.9 % (ref 11.5–15.5)
WBC: 21.8 K/uL — ABNORMAL HIGH (ref 4.0–10.5)

## 2015-06-10 LAB — SALICYLATE LEVEL: Salicylate Lvl: <4 mg/dL (ref 2.8–30.0)

## 2015-06-10 LAB — ETHANOL: Alcohol, Ethyl (B): <5 mg/dL

## 2015-06-10 LAB — ACETAMINOPHEN LEVEL: Acetaminophen (Tylenol), Serum: <10 ug/mL — ABNORMAL LOW (ref 10–30)

## 2015-06-10 MED ORDER — SODIUM CHLORIDE 0.9 % IV BOLUS (SEPSIS)
1000.0000 mL | Freq: Once | INTRAVENOUS | Status: AC
  Administered 2015-06-10: 1000 mL via INTRAVENOUS

## 2015-06-10 MED ORDER — IOHEXOL 300 MG/ML  SOLN
75.0000 mL | Freq: Once | INTRAMUSCULAR | Status: AC | PRN
  Administered 2015-06-11: 75 mL via INTRAVENOUS

## 2015-06-10 NOTE — ED Notes (Signed)
Patient arrives by EMS[place] and doors, asking for help.  EMS states patient pulling at clothes, hallucinating that a rat was biting her and a rat was inside her.  Screaming.  EMS administered Haldol 5 mg and Versed 5 mg IM and patient is currently sedated.

## 2015-06-10 NOTE — ED Notes (Signed)
Attempted I&O cath without urine return. Placement was verified by 2 staff members. Will bladder scan after patient returns from radiology.

## 2015-06-10 NOTE — ED Provider Notes (Addendum)
CSN: [id number]     Arrival date & time 06/10/15  2039 History   First MD Initiated Contact with Patient 06/10/15 2123     Chief Complaint  Patient presents with  . Hallucinating     The history is provided by the EMS personnel. No language interpreter was used.   Nancy Proctor is a 24 y.o. female who presents to the Emergency Department complaining of  Hallucinations.  Level V caveat due to sedation.  Per EMS report the [place] b[place] on [place] windows and doors asking for help. She is pulling at her clothes and hallucinating that rats were biting her and inside her she was screaming at the time. She received sedation with Haldol and Versed IM prior to ED arrival.   Past Medical History  Diagnosis Date  . Anemia   . Cocaine abuse    History reviewed. No pertinent past surgical history. Family History  Problem Relation Age of Onset  . Cancer Mother   . Hypertension Father   . Diabetes Other   . Heart attack Other    Social History  Substance Use Topics  . Smoking status: Never Smoker   . Smokeless tobacco: None  . Alcohol Use: Yes     Comment: occ   OB History    No data available     Review of Systems  Unable to perform ROS: Patient nonverbal      Allergies  Peanut butter flavor and Soy allergy  Home Medications   Prior to Admission medications   Medication Sig Start Date End Date Taking? Authorizing Provider  ibuprofen (ADVIL,MOTRIN) 200 MG tablet Take 400 mg by mouth every 8 (eight) hours as needed for headache or moderate pain (headache & pain).     Historical Provider, MD   BP 119/75 mmHg  Pulse 100  Temp(Src) 98.6 F (37 C) (Oral)  Resp 15  SpO2 100% Physical Exam  Constitutional: She appears well-developed and well-nourished.  Sleeping  HENT:  Head: Normocephalic.  Contusions on the right periorbital region.  Cardiovascular: Normal rate and regular rhythm.   No murmur heard. Pulmonary/Chest: Effort normal and breath sounds  normal. No respiratory distress.  Healing abrasions to back  Abdominal: Soft. There is no tenderness. There is no rebound and no guarding.  Musculoskeletal: She exhibits no edema or tenderness.  Neurological:  Sleeping, arouses to verbal stimuli but falls immediately back to sleep.  Skin: Skin is warm and dry.  Psychiatric:  Unable to assess  Nursing note and vitals reviewed.   ED Course  Procedures (including critical care time) Labs Review Labs Reviewed  COMPREHENSIVE METABOLIC PANEL - Abnormal; Notable for the following:    CO2 20 (*)    BUN 23 (*)    Creatinine, Ser 1.17 (*)    Total Protein 8.2 (*)    AST 173 (*)    ALT 58 (*)    Total Bilirubin 1.7 (*)    Anion gap 16 (*)    All other components within normal limits  ACETAMINOPHEN LEVEL - Abnormal; Notable for the following:    Acetaminophen (Tylenol), Serum <10 (*)    All other components within normal limits  CBC - Abnormal; Notable for the following:    WBC 21.8 (*)    All other components within normal limits  ETHANOL  SALICYLATE LEVEL  URINE RAPID DRUG SCREEN, HOSP PERFORMED  URINALYSIS, ROUTINE W REFLEX MICROSCOPIC (NOT AT [facility])  I-STAT BETA HCG BLOOD, ED ([facility], [facility], [facility]  ONLY)    Imaging Review No results found. I have personally reviewed and evaluated these images and lab results as part of my medical decision-making.   EKG Interpretation None      MDM   Final diagnoses:  Acute psychosis    Patient here for evaluation following a psychotic episode prior to ED arrival. She received sedating medicines by EMS and is difficult to evaluate due to somnolence. She does have evidence of bruising to her head, we'll check CT scan to rule out intracranial abnormality. She has a leukocytosis of unclear significance. Mild elevation in her creatinine will provide IV fluids. Plan to monitor in the emergency department pending metabolization of medications.      Tilden FossaElizabeth Posey Petrik, MD 06/10/15 2308  CXR with  pneumomediastinum, trauma scans obtained given patient cannot provide hx.  CT scans demonstrate pneumomediastinum, no additional acute abnormalities.  Discussed with Dr. Daphine DeutscherMartin with Surgery.  Recommends observation, re-assess when patient awakens to evaluate symptomatology.    Tilden FossaElizabeth Larenda Reedy, MD 06/11/15 [phone]

## 2015-06-10 NOTE — ED Notes (Signed)
Bladder scan pt 
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
## 2015-06-10 NOTE — ED Notes (Signed)
Patient transported to CT 

## 2015-06-11 ENCOUNTER — Encounter (HOSPITAL_COMMUNITY): Payer: Self-pay | Admitting: *Deleted

## 2015-06-11 ENCOUNTER — Inpatient Hospital Stay (HOSPITAL_COMMUNITY): Payer: Medicaid Other

## 2015-06-11 ENCOUNTER — Emergency Department (HOSPITAL_COMMUNITY): Payer: Medicaid Other

## 2015-06-11 DIAGNOSIS — J690 Pneumonitis due to inhalation of food and vomit: Secondary | ICD-10-CM | POA: Diagnosis present

## 2015-06-11 DIAGNOSIS — J982 Interstitial emphysema: Secondary | ICD-10-CM | POA: Diagnosis present

## 2015-06-11 DIAGNOSIS — R443 Hallucinations, unspecified: Secondary | ICD-10-CM | POA: Diagnosis not present

## 2015-06-11 DIAGNOSIS — Z91018 Allergy to other foods: Secondary | ICD-10-CM | POA: Diagnosis not present

## 2015-06-11 DIAGNOSIS — E876 Hypokalemia: Secondary | ICD-10-CM | POA: Diagnosis present

## 2015-06-11 DIAGNOSIS — A419 Sepsis, unspecified organism: Secondary | ICD-10-CM | POA: Diagnosis present

## 2015-06-11 DIAGNOSIS — Z91010 Allergy to peanuts: Secondary | ICD-10-CM | POA: Diagnosis not present

## 2015-06-11 DIAGNOSIS — F29 Unspecified psychosis not due to a substance or known physiological condition: Secondary | ICD-10-CM

## 2015-06-11 DIAGNOSIS — F23 Brief psychotic disorder: Secondary | ICD-10-CM | POA: Diagnosis present

## 2015-06-11 DIAGNOSIS — J189 Pneumonia, unspecified organism: Secondary | ICD-10-CM

## 2015-06-11 DIAGNOSIS — F1410 Cocaine abuse, uncomplicated: Secondary | ICD-10-CM | POA: Diagnosis not present

## 2015-06-11 LAB — URINE MICROSCOPIC-ADD ON: RBC / HPF: NONE SEEN RBC/hpf (ref 0–5)

## 2015-06-11 LAB — CBC WITH DIFFERENTIAL/PLATELET
Basophils Absolute: 0 10*3/uL (ref 0.0–0.1)
Basophils Relative: 0 %
EOS PCT: 0 %
Eosinophils Absolute: 0 10*3/uL (ref 0.0–0.7)
HCT: 33.4 % — ABNORMAL LOW (ref 36.0–46.0)
Hemoglobin: 12.2 g/dL (ref 12.0–15.0)
LYMPHS ABS: 2 10*3/uL (ref 0.7–4.0)
LYMPHS PCT: 18 %
MCH: 29 pg (ref 26.0–34.0)
MCHC: 36.5 g/dL — AB (ref 30.0–36.0)
MCV: 79.3 fL (ref 78.0–100.0)
MONO ABS: 0.9 10*3/uL (ref 0.1–1.0)
MONOS PCT: 8 %
Neutro Abs: 8 10*3/uL — ABNORMAL HIGH (ref 1.7–7.7)
Neutrophils Relative %: 74 %
PLATELETS: 253 10*3/uL (ref 150–400)
RBC: 4.21 MIL/uL (ref 3.87–5.11)
RDW: 14 % (ref 11.5–15.5)
WBC: 10.9 10*3/uL — AB (ref 4.0–10.5)

## 2015-06-11 LAB — URINALYSIS, ROUTINE W REFLEX MICROSCOPIC
GLUCOSE, UA: NEGATIVE mg/dL
GLUCOSE, UA: NEGATIVE mg/dL
HGB URINE DIPSTICK: NEGATIVE
HGB URINE DIPSTICK: NEGATIVE
KETONES UR: 40 mg/dL — AB
Ketones, ur: >80 mg/dL — AB
Leukocytes, UA: NEGATIVE
Nitrite: NEGATIVE
Nitrite: NEGATIVE
PH: 5.5 (ref 5.0–8.0)
PH: 6 (ref 5.0–8.0)
PROTEIN: NEGATIVE mg/dL
Protein, ur: 30 mg/dL — AB
Specific Gravity, Urine: 1.045 — ABNORMAL HIGH (ref 1.005–1.030)

## 2015-06-11 LAB — COMPREHENSIVE METABOLIC PANEL
ALK PHOS: 52 U/L (ref 38–126)
ALT: 41 U/L (ref 14–54)
ANION GAP: 7 (ref 5–15)
AST: 89 U/L — ABNORMAL HIGH (ref 15–41)
Albumin: 3.1 g/dL — ABNORMAL LOW (ref 3.5–5.0)
BILIRUBIN TOTAL: 1.5 mg/dL — AB (ref 0.3–1.2)
BUN: 8 mg/dL (ref 6–20)
CALCIUM: 8.4 mg/dL — AB (ref 8.9–10.3)
CO2: 22 mmol/L (ref 22–32)
CREATININE: 0.85 mg/dL (ref 0.44–1.00)
Chloride: 107 mmol/L (ref 101–111)
Glucose, Bld: 125 mg/dL — ABNORMAL HIGH (ref 65–99)
Potassium: 2.9 mmol/L — ABNORMAL LOW (ref 3.5–5.1)
SODIUM: 136 mmol/L (ref 135–145)
TOTAL PROTEIN: 6 g/dL — AB (ref 6.5–8.1)

## 2015-06-11 LAB — TYPE AND SCREEN
ABO/RH(D): O POS
ANTIBODY SCREEN: NEGATIVE

## 2015-06-11 LAB — RAPID URINE DRUG SCREEN, HOSP PERFORMED
AMPHETAMINES: NOT DETECTED
Amphetamines: NOT DETECTED
Barbiturates: NOT DETECTED
Barbiturates: NOT DETECTED
Benzodiazepines: POSITIVE — AB
Benzodiazepines: POSITIVE — AB
COCAINE: NOT DETECTED
COCAINE: NOT DETECTED
OPIATES: NOT DETECTED
OPIATES: NOT DETECTED
TETRAHYDROCANNABINOL: NOT DETECTED
TETRAHYDROCANNABINOL: NOT DETECTED

## 2015-06-11 LAB — PROTIME-INR
INR: 1.28 (ref 0.00–1.49)
Prothrombin Time: 16.1 seconds — ABNORMAL HIGH (ref 11.6–15.2)

## 2015-06-11 LAB — STREP PNEUMONIAE URINARY ANTIGEN: STREP PNEUMO URINARY ANTIGEN: NEGATIVE

## 2015-06-11 LAB — LACTIC ACID, PLASMA: LACTIC ACID, VENOUS: 0.7 mmol/L (ref 0.5–2.0)

## 2015-06-11 LAB — I-STAT CG4 LACTIC ACID, ED: Lactic Acid, Venous: 0.77 mmol/L (ref 0.5–2.0)

## 2015-06-11 LAB — APTT: APTT: 27 s (ref 24–37)

## 2015-06-11 LAB — ABO/RH: ABO/RH(D): O POS

## 2015-06-11 LAB — PROCALCITONIN

## 2015-06-11 MED ORDER — SODIUM CHLORIDE 0.9 % IV BOLUS (SEPSIS)
1000.0000 mL | Freq: Once | INTRAVENOUS | Status: AC
  Administered 2015-06-11: 1000 mL via INTRAVENOUS

## 2015-06-11 MED ORDER — SODIUM CHLORIDE 0.9 % IV SOLN
INTRAVENOUS | Status: DC
  Administered 2015-06-11 – 2015-06-13 (×2): via INTRAVENOUS

## 2015-06-11 MED ORDER — IBUPROFEN 600 MG PO TABS
600.0000 mg | ORAL_TABLET | Freq: Three times a day (TID) | ORAL | Status: DC | PRN
Start: 2015-06-11 — End: 2015-06-15
  Administered 2015-06-14: 600 mg via ORAL
  Filled 2015-06-11: qty 1

## 2015-06-11 MED ORDER — LORAZEPAM 1 MG PO TABS
1.0000 mg | ORAL_TABLET | Freq: Three times a day (TID) | ORAL | Status: DC | PRN
Start: 2015-06-11 — End: 2015-06-15

## 2015-06-11 MED ORDER — ALBUTEROL SULFATE (2.5 MG/3ML) 0.083% IN NEBU: 2.5000 mg | INHALATION_SOLUTION | RESPIRATORY_TRACT | Status: DC | PRN

## 2015-06-11 MED ORDER — HEPARIN SODIUM (PORCINE) 5000 UNIT/ML IJ SOLN
5000.0000 [IU] | Freq: Three times a day (TID) | INTRAMUSCULAR | Status: DC
  Administered 2015-06-13 – 2015-06-14 (×2): 5000 [IU] via SUBCUTANEOUS
  Filled 2015-06-11 (×12): qty 1

## 2015-06-11 MED ORDER — SODIUM CHLORIDE 0.9 % IV BOLUS (SEPSIS)
1000.0000 mL | INTRAVENOUS | Status: AC
  Administered 2015-06-11 (×2): 1000 mL via INTRAVENOUS

## 2015-06-11 MED ORDER — DEXTROSE 5 % IV SOLN
500.0000 mg | Freq: Once | INTRAVENOUS | Status: AC
  Administered 2015-06-11: 500 mg via INTRAVENOUS
  Filled 2015-06-11: qty 500

## 2015-06-11 MED ORDER — POTASSIUM CHLORIDE 10 MEQ/100ML IV SOLN
10.0000 meq | INTRAVENOUS | Status: AC
  Administered 2015-06-11 – 2015-06-12 (×4): 10 meq via INTRAVENOUS
  Filled 2015-06-11 (×4): qty 100

## 2015-06-11 MED ORDER — PIPERACILLIN-TAZOBACTAM 3.375 G IVPB
3.3750 g | Freq: Three times a day (TID) | INTRAVENOUS | Status: DC
  Administered 2015-06-12 – 2015-06-13 (×6): 3.375 g via INTRAVENOUS
  Filled 2015-06-11 (×8): qty 50

## 2015-06-11 MED ORDER — ACETAMINOPHEN 325 MG PO TABS
650.0000 mg | ORAL_TABLET | ORAL | Status: DC | PRN
  Administered 2015-06-11 – 2015-06-15 (×2): 650 mg via ORAL
  Filled 2015-06-11 (×2): qty 2

## 2015-06-11 MED ORDER — ONDANSETRON HCL 4 MG PO TABS: 4.0000 mg | ORAL_TABLET | Freq: Three times a day (TID) | ORAL | Status: DC | PRN

## 2015-06-11 MED ORDER — DEXTROSE 5 % IV SOLN
500.0000 mg | INTRAVENOUS | Status: DC
  Administered 2015-06-12: 500 mg via INTRAVENOUS
  Filled 2015-06-11 (×2): qty 500

## 2015-06-11 MED ORDER — DEXTROSE 5 % IV SOLN: 1.0000 g | INTRAVENOUS | Status: DC

## 2015-06-11 MED ORDER — AZITHROMYCIN 500 MG IV SOLR
500.0000 mg | INTRAVENOUS | Status: DC
Start: 2015-06-11 — End: 2015-06-11

## 2015-06-11 MED ORDER — CEFTRIAXONE SODIUM 1 G IJ SOLR
1.0000 g | Freq: Once | INTRAMUSCULAR | Status: AC
  Administered 2015-06-11: 1 g via INTRAVENOUS
  Filled 2015-06-11: qty 10

## 2015-06-11 NOTE — ED Notes (Signed)
Pt able to make focused statements (phone numbers, breakfast, speaking with her aunt, physical assessment) and request to this Clinical research associatewriter. Oriented to family on the phone however became anxious once off the phone and stated "I think I am hallucinating now." I requesting she finish her orange juice and muffin and she was able to follow this command. Shortly following this conversation, she requested that I look at the rats in the corner of the room. I maintained our conversation on her request about her sister. She also requesting that the doctor evaluate her for the bites on her body.

## 2015-06-11 NOTE — Progress Notes (Signed)
ANTIBIOTIC CONSULT NOTE - INITIAL  Pharmacy Consult for Azithromycin, Ceftriaxone Indication:  Allergies  Allergen Reactions  . Peanut Butter Flavor Anaphylaxis  . Soy Allergy Anaphylaxis    Patient Measurements: Height: 5\' 2"  (157.5 cm) Weight: 130 lb (58.968 kg) IBW/kg (Calculated) : 50.1 Adjusted Body Weight:   Vital Signs: Temp: 100.5 F (38.1 C) (11/22 1706) Temp Source: Oral (11/22 1706) BP: 121/60 mmHg (11/22 1706) Pulse Rate: 114 (11/22 1706) Intake/Output from previous day: 11/21 0701 - 11/22 0700 In: -  Out: 400 [Urine:400] Intake/Output from this shift: Total I/O In: 1750 [P.O.:750; I.V.:1000] Out: -   Labs:  Recent Labs  06/10/15 2109  WBC 21.8*  HGB 14.8  PLT 324  CREATININE 1.17*   Estimated Creatinine Clearance: 59.1 mL/min (by C-G formula based on Cr of 1.17). No results for input(s): VANCOTROUGH, VANCOPEAK, VANCORANDOM, GENTTROUGH, GENTPEAK, GENTRANDOM, TOBRATROUGH, TOBRAPEAK, TOBRARND, AMIKACINPEAK, AMIKACINTROU, AMIKACIN in the last 72 hours.   Microbiology: No results found for this or any previous visit (from the past 720 hour(s)).  Medical History: Past Medical History  Diagnosis Date  . Anemia   . Cocaine abuse      Assessment: [age] yoF presented to the ED 11/21 with hallucinations.  Pharmacy consulted to dose azithromycin and ceftriaxone for CAP.  CXR shows persistent pneumomediastinum. Lungs clear. No pneumothorax apparent.  - WBC 21.8 - Temp 100.5 - Blood cultures collected, urine culture ordered  Goal of Therapy:  Eradication of infection  Plan:  1.  Azithromycin 500 mg IV q24h. 2.  Ceftriaxone 1g IV q24h. 3.  Since both antibiotics do not require renal dose adjustments, do not anticipate need for further dosage adjustment.  Will sign off at this time.  Please reconsult if a change in clinical status warrants re-evaluation of dosage.   Clance Bollunyon, Bethany Cumming 06/11/2015,5:25 PM

## 2015-06-11 NOTE — ED Notes (Signed)
TTS UPDATED BY PHONE. PT MOVED TO ROOM 31. REQUESTING INTAKE MONITOR BE PLACED ON ROOM.

## 2015-06-11 NOTE — ED Notes (Signed)
Pt asked about her plan of care.  This RN explained that she was waiting to see a psychiatrist, because when she came in, she was seeing rats that now one else could see.  Pt, then, asked "why can't I move?  That doesn't explain why I can't move."    Pt noted to be moving arms.
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
## 2015-06-11 NOTE — ED Notes (Signed)
Pt resting in room

## 2015-06-11 NOTE — ED Notes (Addendum)
Per Morrie SheldonAshley reporting nurse from the ER < pt has been medically cleared by EDP. Per Ashely , EDP aware pt is hoarse and that pt has a small bruise the size of a quarter on the right side of her face near her eye.. Pt stated she sees rats all over the room. Pt mumbles at times. She did tell the writer she is homeless but would not offer any other information. Pt was set up and presently is eating lunch.Report given to Alaina. Pt does have a left hand heplock.

## 2015-06-11 NOTE — ED Provider Notes (Signed)
Patient rested for 13 hours since receiving Haldol and Ativan by outside providers. Patient has pneumomediastinum but it is unchanged. She is got normal vital signs. Breathing normally on room air. Patient still intermittently saying that there are rats, seeing things that are not present. We will have her do TTS consult now. She is medically cleared from our standpoint.  Muhammad Vacca Randall AnLyn Rani Idler, MD 06/11/15 1223

## 2015-06-11 NOTE — Progress Notes (Signed)
TCTS BRIEF PROGRESS NOTE   Discussed patient's case over the telephone with Dr. Clyde LundborgNiu from hospitalist team who was uncomfortable with recommendations made previously by Dr. Daphine DeutscherMartin.  Situation and CT scan reviewed.  I agree with plans to keep the patient strictly NPO and observing her in ICU or step-down for now on IV antibiotics.  Cause of pneumomediastinum remains unclear at this time but diffuse alveolar rupture remains most likely.  Prior to initiation of any oral intake she will need water-soluble oral contrast radiographic study, possibly to include both fluoroscopy and CT imaging to r/o injury to hypopharynx and/or esophagus.  Will follow.  Purcell Nailslarence H Owen, MD 06/11/2015 7:49 PM

## 2015-06-11 NOTE — ED Notes (Signed)
Bed: [id number] Expected date:  Expected time:  Means of arrival:  Comments: [place]

## 2015-06-11 NOTE — ED Notes (Signed)
Pt is awake and given a sprite, wants more medication because she's still seeing things

## 2015-06-11 NOTE — Progress Notes (Signed)
ANTIBIOTIC CONSULT NOTE - INITIAL  Pharmacy Consult for Zosyn  Indication: Possible Aspiration PNA  Allergies  Allergen Reactions  . Peanut Butter Flavor Anaphylaxis  . Soy Allergy Anaphylaxis    Patient Measurements: Height: 5\' 2"  (157.5 cm) Weight: 130 lb (58.968 kg) IBW/kg (Calculated) : 50.1 Vital Signs: Temp: 100.5 F (38.1 C) (11/22 1706) Temp Source: Oral (11/22 1706) BP: 119/75 mmHg (11/22 2000) Pulse Rate: 95 (11/22 2000)  Labs:  Recent Labs  06/10/15 2109 06/11/15 1749  WBC 21.8* 10.9*  HGB 14.8 12.2  PLT 324 253  CREATININE 1.17* 0.85   Estimated Creatinine Clearance: 81.4 mL/min (by C-G formula based on Cr of 0.85).  Medical History: Past Medical History  Diagnosis Date  . Anemia   . Cocaine abuse    Assessment: 24 y/o F to start Zosyn for possible aspiration PNA, WBC 10.9, renal function good, on azithromycin for atypical coverage  Plan:  -Zosyn 3.375G IV q8h to be infused over 4 hours -Trend WBC, temp, renal function  -F/U infectious work-up  Abran DukeLedford, Steffi Noviello 06/11/2015,11:47 PM

## 2015-06-11 NOTE — H&P (Addendum)
[facility] History and Physical  Nancy Proctor ONG:[id number] DOB: [birth year] DOA: 06/10/2015  Referring physician: ED physician [facility]CP: [facility]  Specialists:   Chief Complaint: Hallucination, AMS and coughing   HPI: Nancy Proctor is a 24 y.o. female with PMH of anemia, cocaine abuse, who presents with a hallucination and AMS  Patient has AMS and is unable to provide medical history, therefore, most of the history is obtained by discussing the case with ED physician and with the nursing staff.  It seems that patient has been here since yesterday due to hallucination. Per documentation in the epic chart, "the patient was pulling at her clothes because she thought that there was a rat inside her, then she stated that the rat was biting".She also had agitation. Therefore, the patient was administered Haldol and Versed. She was evaluated by psych, Dr. Elisabeth Most who diagnosed pt as "Psychotic Disorder with hallucinations". Per Dr. Elisabeth Most, patient needs psych inpatient admission. However, she was accidentally found to have extensive pneumomediastinum. Therefore we are asked to admit this pt. When I saw pt in ED, she is confused and cannot provide accurate history, but patient is obviously coughing. She moves all extremities. No diarrhea noticed.   In ED, patient was found to have positive UDS for Benzo, negative urinalysis, lactate is 0.77, WBC 10.9, fever with temperature 100.5, tachycardia, potassium 2.9, renal function okay. Chest x-ray showed pneumomediastinum with linear gas in the base of the neck.   CT-chest and abd/pelvis showed extensive pneumo mediastinum demonstrated. Patchy nodular areas of infiltration in the left lingula and right upper lung likely represent inflammatory process orpneumonia. Etiology of pneumo mediastinum is indeterminate although alveolar rupture is favored due to the other lung parenchymal process demonstrated. No acute process demonstrated in the  abdomen or pelvis.  CT-C spin and head showed mild chronic ethmoid sinusitis. Intracranial structures appear unremarkable. suspected remote left medial orbital wall fracture. No evidence of fracture or subluxation along the cervical spine.   Where does patient live?   At home    Can patient participate in ADLs?  Yes  Review of Systems: Could not be reviewed due to altered mental status.  Allergy:  Allergies  Allergen Reactions  . Peanut Butter Flavor Anaphylaxis  . Soy Allergy Anaphylaxis    Past Medical History  Diagnosis Date  . Anemia   . Cocaine abuse     History reviewed. No pertinent past surgical history.  Social History:  reports that she has never smoked. She does not have any smokeless tobacco history on file. She reports that she drinks alcohol. She reports that she uses illicit drugs (Cocaine).  Family History:  Family History  Problem Relation Age of Onset  . Cancer Mother   . Hypertension Father   . Diabetes Other   . Heart attack Other      Prior to Admission medications   Not on File    Physical Exam: Filed Vitals:   06/11/15 1830 06/11/15 1900 06/11/15 1930 06/11/15 2000  BP: 119/62 121/65 113/69 119/75  Pulse: 109 107 100 95  Temp:      TempSrc:      Resp: Height:      Weight:      SpO2: 97% 97% 98% 97%   General: Not in acute distress HEENT:       Eyes: PERRL, EOMI, no scleral icterus.       ENT: No discharge from the ears and nose, no pharynx injection, no tonsillar  enlargement.        Neck: No JVD, no bruit, no mass felt. Heme: No neck lymph node enlargement. Cardiac: S1/S2, RRR, No murmurs, No gallops or rubs. Pulm: No rales, wheezing, rhonchi or rubs. Abd: Soft, nondistended, nontender, no rebound pain, no organomegaly, BS present. Ext: No pitting leg edema bilaterally. 2+DP/PT pulse bilaterally. Musculoskeletal: No joint deformities, No joint redness or warmth, no limitation of ROM in spin. Skin: No rashes.  Neuro:  confused, not oriented X3, cranial nerves II-XII grossly intact, moves all extremities. Psych: Patient is is psychotic, but  no suicidal or hemocidal ideation.  Labs on Admission:  Basic Metabolic Panel:  Recent Labs Lab 06/10/15 2109 06/11/15 1749  NA 138 136  K 3.6 2.9*  CL 102 107  CO2 20* 22  GLUCOSE 76 125*  BUN 23* 8  CREATININE 1.17* 0.85  CALCIUM 9.7 8.4*   Liver Function Tests:  Recent Labs Lab 06/10/15 2109 06/11/15 1749  AST 173* 89*  ALT 58* 41  ALKPHOS 61 52  BILITOT 1.7* 1.5*  PROT 8.2* 6.0*  ALBUMIN 4.6 3.1*   No results for input(s): LIPASE, AMYLASE in the last 168 hours. No results for input(s): AMMONIA in the last 168 hours. CBC:  Recent Labs Lab 06/10/15 2109 06/11/15 1749  WBC 21.8* 10.9*  NEUTROABS  --  8.0*  HGB 14.8 12.2  HCT 41.1 33.4*  MCV 81.1 79.3  PLT 324 253   Cardiac Enzymes: No results for input(s): CKTOTAL, CKMB, CKMBINDEX, TROPONINI in the last 168 hours.  BNP (last 3 results) No results for input(s): BNP in the last 8760 hours.  ProBNP (last 3 results) No results for input(s): PROBNP in the last 8760 hours.  CBG: No results for input(s): GLUCAP in the last 168 hours.  Radiological Exams on Admission: Dg Chest 1 View  06/11/2015  CLINICAL DATA:  Pneumomediastinum.  Cocaine use EXAM: CHEST 1 VIEW COMPARISON:  June 10, 2015 chest radiograph; chest CT June 11, 2015 FINDINGS: The previously noted pneumomediastinum is again apparent, primarily in the superior mediastinum neck regions. Air is also seen in the right supraclavicular and left supraclavicular infraclavicular regions. There is no pneumothorax. The lungs are clear. The heart size and pulmonary vascularity are normal. No adenopathy. No bone lesions. IMPRESSION: Persistent pneumomediastinum. Lungs clear. No pneumothorax apparent. No change in cardiac silhouette. Electronically Signed   By: Bretta Bang III M.D.   On: 06/11/2015 07:10   Dg Chest 1  View  06/10/2015  CLINICAL DATA:  Hallucinations. EXAM: CHEST 1 VIEW COMPARISON:  None. FINDINGS: Linear gas collections in the base of the neck and extending into the upper mediastinum consistent with pneumomediastinum. Cause is indeterminate. Possibilities would include penetrating injury, barotrauma, Valsalva, tracheal or esophageal injury. Normal heart size and pulmonary vascularity. No focal airspace disease or consolidation in the lungs. No blunting of costophrenic angles. No pneumothorax. Mediastinal contours appear intact. IMPRESSION: Pneumomediastinum with linear gas in the base of the neck. Etiology is indeterminate. These results were called by telephone at the time of interpretation on 06/10/2015 at 11:30 pm to Dr. Tilden Fossa , who verbally acknowledged these results. Electronically Signed   By: Burman Nieves M.D.   On: 06/10/2015 23:33   Ct Head Wo Contrast  06/10/2015  CLINICAL DATA:  Hallucinations and abrasions with lacerations of the face. EXAM: CT HEAD WITHOUT CONTRAST TECHNIQUE: Contiguous axial images were obtained from the base of the skull through the vertex without intravenous contrast. COMPARISON:  Chest radiograph from 06/10/2015  FINDINGS: There is gas tracking in the parapharyngeal spaces of the neck in an abnormal fashion. Looking at the chest radiograph, there appears to be pneumomediastinum and gas tracking in the neck as well. The brainstem, cerebellum, cerebral peduncles, thalamus, basal ganglia, basilar cisterns, and ventricular system appear within normal limits. No intracranial hemorrhage, mass lesion, or acute CVA. Mild chronic ethmoid sinusitis. Left medial orbital wall concavity suggesting a remote medial orbital wall fracture. IMPRESSION: 1. Abnormal gas in the upper neck, tracking bilaterally along the parapharyngeal spaces. The origin of this gas is not certain. On the chest radiograph there is gas tracking in the neck and possibly in the upper mediastinum.  Consider dedicated neck CT and/or chest CT for further workup. 2. Mild chronic ethmoid sinusitis. 3. Intracranial structures appear unremarkable. 4. Suspected remote left medial orbital wall fracture. Electronically Signed   By: Gaylyn RongWalter  Liebkemann M.D.   On: 06/10/2015 23:20   Ct Chest W Contrast  06/11/2015  CLINICAL DATA:  Patient presents with hallucinations. Pneumomediastinum demonstrated on chest radiograph. EXAM: CT CHEST, ABDOMEN, AND PELVIS WITH CONTRAST TECHNIQUE: Multidetector CT imaging of the chest, abdomen and pelvis was performed following the standard protocol during bolus administration of intravenous contrast. CONTRAST:  75mL OMNIPAQUE IOHEXOL 300 MG/ML  SOLN COMPARISON:  None. FINDINGS: CT CHEST FINDINGS Mediastinum/Lymph Nodes: No mass or lymphadenopathy. There is fairly extensive pneumomediastinum extending throughout the anterior, middle, and posterior mediastinum and along the posterior paraspinal soft tissues. Esophagus is decompressed. No mediastinal fluid collections. Lungs/Pleura: Patchy nodular infiltration in the left lingula and in the posterior right upper lung likely to represent pneumonia although parenchymal hemorrhage or contusion also possible. No pneumothorax. Musculoskeletal: No chest wall mass or suspicious bone lesions identified. No displaced rib fractures identified. CT ABDOMEN PELVIS FINDINGS Hepatobiliary: No masses or other significant abnormality. Pancreas: No mass, inflammatory changes, or other significant abnormality. Spleen: Within normal limits in size and appearance. Adrenals/Urinary Tract: No masses identified. No evidence of hydronephrosis. Stomach/Bowel: No evidence of obstruction, inflammatory process, or abnormal fluid collections. Vascular/Lymphatic: No pathologically enlarged lymph nodes. No evidence of abdominal aortic aneurysm. Reproductive: No mass or other significant abnormality. Other: None. Musculoskeletal:  No suspicious bone lesions identified.  IMPRESSION: Extensive pneumo mediastinum demonstrated. Patchy nodular areas of infiltration in the left lingula and right upper lung likely represent inflammatory process or pneumonia. Etiology of pneumo mediastinum is indeterminate although alveolar rupture is favored due to the other lung parenchymal process demonstrated. No acute process demonstrated in the abdomen or pelvis. Electronically Signed   By: Burman NievesWilliam  Stevens M.D.   On: 06/11/2015 00:27   Ct Cervical Spine Wo Contrast  06/11/2015  CLINICAL DATA:  Hallucinations. Concern for cervical spine injury. Initial encounter. EXAM: CT CERVICAL SPINE WITHOUT CONTRAST TECHNIQUE: Multidetector CT imaging of the cervical spine was performed without intravenous contrast. Multiplanar CT image reconstructions were also generated. COMPARISON:  None. FINDINGS: Diffuse soft tissue air is noted tracking along the neck, extending along the parapharyngeal fat planes, prevertebral space and tracking about the mediastinum and thyroid gland. This appears to arise from the chest, on concurrent chest CT images. There is no evidence of fracture or subluxation. Vertebral bodies demonstrate normal height and alignment. Intervertebral disc spaces are preserved. Prevertebral soft tissues are within normal limits. The visualized neural foramina are grossly unremarkable. The thyroid gland is unremarkable in appearance. The visualized lung apices are clear. No significant soft tissue abnormalities are seen. The visualized portions of the brain are unremarkable in appearance. IMPRESSION: 1. No evidence  of fracture or subluxation along the cervical spine. 2. Diffuse soft tissue air noted tracking about the neck. On concurrent CT of the chest, this appears to arise from the chest. Electronically Signed   By: Roanna Raider M.D.   On: 06/11/2015 00:23   Ct Abdomen Pelvis W Contrast  06/11/2015  CLINICAL DATA:  Patient presents with hallucinations. Pneumomediastinum demonstrated on  chest radiograph. EXAM: CT CHEST, ABDOMEN, AND PELVIS WITH CONTRAST TECHNIQUE: Multidetector CT imaging of the chest, abdomen and pelvis was performed following the standard protocol during bolus administration of intravenous contrast. CONTRAST:  75mL OMNIPAQUE IOHEXOL 300 MG/ML  SOLN COMPARISON:  None. FINDINGS: CT CHEST FINDINGS Mediastinum/Lymph Nodes: No mass or lymphadenopathy. There is fairly extensive pneumomediastinum extending throughout the anterior, middle, and posterior mediastinum and along the posterior paraspinal soft tissues. Esophagus is decompressed. No mediastinal fluid collections. Lungs/Pleura: Patchy nodular infiltration in the left lingula and in the posterior right upper lung likely to represent pneumonia although parenchymal hemorrhage or contusion also possible. No pneumothorax. Musculoskeletal: No chest wall mass or suspicious bone lesions identified. No displaced rib fractures identified. CT ABDOMEN PELVIS FINDINGS Hepatobiliary: No masses or other significant abnormality. Pancreas: No mass, inflammatory changes, or other significant abnormality. Spleen: Within normal limits in size and appearance. Adrenals/Urinary Tract: No masses identified. No evidence of hydronephrosis. Stomach/Bowel: No evidence of obstruction, inflammatory process, or abnormal fluid collections. Vascular/Lymphatic: No pathologically enlarged lymph nodes. No evidence of abdominal aortic aneurysm. Reproductive: No mass or other significant abnormality. Other: None. Musculoskeletal:  No suspicious bone lesions identified. IMPRESSION: Extensive pneumo mediastinum demonstrated. Patchy nodular areas of infiltration in the left lingula and right upper lung likely represent inflammatory process or pneumonia. Etiology of pneumo mediastinum is indeterminate although alveolar rupture is favored due to the other lung parenchymal process demonstrated. No acute process demonstrated in the abdomen or pelvis. Electronically Signed    By: Burman Nieves M.D.   On: 06/11/2015 00:27    EKG: Not done in ED, will get one.   Assessment/Plan Principal Problem:   CAP (community acquired pneumonia) Active Problems:   Cocaine abuse   Psychoses   Pneumomediastinum (HCC)   Hallucination   Sepsis (HCC)   Hypokalemia   Aspiration pneumonia (HCC)  CAP vs. aspiration pneumonia: CT-chest showed patchy nodular areas of infiltration in the left lingula and right upper lung, indicating possible pneumonia, aspiration pneumonia vs CAP.  - Will admit to SDU  - EDP started rocephin and azithromycin, will switch to Zosyn plus azithromycin  - Mucinex for cough  - Albuterol Neb prn for SOB - Urine legionella and S. pneumococcal antigen - Follow up blood culture x2, sputum culture, plus Flu pcr  Sepsis secondary to PAN: Patient meed criteria for sepsis including leukocytosis, fever and tachycardia. She is hemodynamically stable. - will get Procalcitonin and trend lactic acid level per sepsis protocol - IVF: 3L of NS bolus in ED, followed by 75 mL per hour of NS  Psychoses: evaluated by Dr. Elisabeth Most. Pt is calm now. -prn Ativan now -f/u further recommendations  Pneumomediastinum [facility]): Cause of pneumomediastinum remains unclear at this time. EDP discussed with the general surgeon, Dr. Daphine Deutscher who recommended observation. I consulted thoracic surgeon, Dr.Owen, He recommended to keep the patient strictly NPO and observing her in ICU or step-down for now on IV antibiotics.Per Dr. Rodman Key to initiation of any oral intake she will need water-soluble oral contrast radiographic study, possibly to include both fluoroscopy and CT imaging to r/o injury to hypopharynx and/or esophagus",  but pt cannot do this this now due to AMS.    -Appreciat Dr. Orvan July recommendations -will repeat CXR in AM -close observation in SDU -f/u Dr. Orvan July recommendations  Hypokalemia: K=2.9 on admission. - Repleted - Check Mg level  Substance abuse: -Need  counseling in patient's mental status improves -check HIV ab  DVT ppx: SCD  Code Status: Full code Family Communication: None at bed side.    Disposition Plan: Admit to inpatient   Date of Service 06/11/2015    Lorretta Harp [facility] Pager [phone]  If 7PM-7AM, please contact night-coverage [email or web address] Password [email or web address] 06/11/2015, 10:50 PM

## 2015-06-11 NOTE — BH Assessment (Signed)
Per Dr. Julieanne CottonJosephine, patient meets criteria for inpatient hospitalization.  [facility] TTS will refer patient to other hospitals.

## 2015-06-11 NOTE — ED Notes (Signed)
Waiting on patient to wake up to re-evaluate

## 2015-06-12 ENCOUNTER — Inpatient Hospital Stay (HOSPITAL_COMMUNITY): Payer: Medicaid Other

## 2015-06-12 DIAGNOSIS — J982 Interstitial emphysema: Secondary | ICD-10-CM

## 2015-06-12 DIAGNOSIS — E876 Hypokalemia: Secondary | ICD-10-CM

## 2015-06-12 DIAGNOSIS — R443 Hallucinations, unspecified: Secondary | ICD-10-CM

## 2015-06-12 DIAGNOSIS — J690 Pneumonitis due to inhalation of food and vomit: Secondary | ICD-10-CM

## 2015-06-12 LAB — HIV ANTIBODY (ROUTINE TESTING W REFLEX): HIV SCREEN 4TH GENERATION: NONREACTIVE

## 2015-06-12 LAB — INFLUENZA PANEL BY PCR (TYPE A & B)
H1N1FLUPCR: NOT DETECTED
INFLAPCR: NEGATIVE
INFLBPCR: NEGATIVE

## 2015-06-12 LAB — MRSA PCR SCREENING: MRSA BY PCR: NEGATIVE

## 2015-06-12 MED ORDER — WHITE PETROLATUM GEL
Status: AC
  Administered 2015-06-12: 07:00:00
  Filled 2015-06-12: qty 1

## 2015-06-12 MED ORDER — IOHEXOL 300 MG/ML  SOLN
150.0000 mL | Freq: Once | INTRAMUSCULAR | Status: DC | PRN
  Administered 2015-06-12: 75 mL via ORAL
  Filled 2015-06-12: qty 150

## 2015-06-12 NOTE — Progress Notes (Signed)
[facility] PROGRESS NOTE  Nancy Proctor:[id number]:[id number] DOB: June 16, 1991 DOA: 06/10/2015 [facility]  Assessment/Plan: 1. Pneumomediastinum -Unclear etiology as patient presented with acute psychotic episode. History was limited. She was found in the charge brain on windows asking for help hallucinating that rats were biting her. -X-ray revealed pneumomediastinum with linear gas of the base of the neck. -She denies history of cocaine abuse and urine drug screen tested negative for cocaine on this hospitalization. -Alveolar rupture likelihood, barotrauma can result from crack smoking partners forcefully exhaling into each other's lungs to increase uptake of drug, although her urine drug screen was negative for cocaine.  -Cardiothoracic surgery was consulted -Repeat 2 view chest x-ray pending this morning -Continue empiric IV antibiotic therapy with Zosyn for now -Await further recommendations from cardiothoracic surgery.  2.  Acute psychosis. -Patient presented with active hallucinations -She is currently stable, denies active hallucinations -Will require inpatient psychiatric treatment when medically stable.  3.  Possible aspiration pneumonia. -Patient was worked up with CT scan of lungs on 06/11/2015 that revealed patchy nodular areas of infiltrate in the left lingular and right upper lung. Findings consistent with pneumonia. -Continue empiric IV antibiotic therapy with IV Zosyn -Follow-up on repeat imaging.  4.  History of substance abuse. -Patient currently denies any pharmacist abuse although had a urine drug screen positive for cocaine on 01/22/2015. -Plan for her to undergo inpatient psychiatric treatment when medically stable.  Code Status full code Family Communication: family not present Disposition Plan: continue close monitoring in step down unit.    Consultants:  Cardiothoracic surgery   Antibiotics:  Zosyn   HPI/Subjective: Ms Orson SlickHarmon is a  24 year old female records indicating she has a history of cocaine abuse, tested positive for cocaine on 01/22/2015, who presented to the emergency department on 06/10/2015 with complaints of hallucinations. She was found to try to charge vein on the windows and doors screaming for help. It appears she was actively hallucinating that rats were biting her. She was sedated with Haldol and Versed. Urine drug screen only came back positive for benzodiazepines. Cocaine was negative. She was evaluated by psychiatry in the emergency room who recommended inpatient psychiatric treatment. However, chest x-ray showed pneumomediastinum with linear gas in the base of the neck. He was admitted to the step down unit for closer observation. Cardiothoracic surgeon was consulted.  Objective: Filed Vitals:   06/12/15 0537 06/12/15 0730  BP: 109/62   Pulse: 100   Temp:  98.6 F (37 C)  Resp: 11     Intake/Output Summary (Last 24 hours) at 06/12/15 0803 Last data filed at 06/12/15 0630  Gross per 24 hour  Intake   3000 ml  Output   1050 ml  Net   1950 ml   Filed Weights   06/11/15 0757 06/11/15 2230  Weight: 58.968 kg (130 lb) 63.7 kg (140 lb 6.9 oz)    Exam:   General:  patient is in no acute distress, she denies active hallucinations   Cardiovascular: Regular rate and rhythm normal S1-S2 no murmurs rubs or gallops   Respiratory: normal respiratory effort lungs are clear   Abdomen: Soft nontender nondistended   Musculoskeletal:  excoriations involving bilateral upper extremities, otherwise no swelling.   Data Reviewed: Basic Metabolic Panel:  Recent Labs Lab 06/10/15 2109 06/11/15 1749  NA 138 136  K 3.6 2.9*  CL 102 107  CO2 20* 22  GLUCOSE 76 125*  BUN 23* 8  CREATININE 1.17* 0.85  CALCIUM 9.7 8.4*  Liver Function Tests:  Recent Labs Lab 06/10/15 2109 06/11/15 1749  AST 173* 89*  ALT 58* 41  ALKPHOS 61 52  BILITOT 1.7* 1.5*  PROT 8.2* 6.0*  ALBUMIN 4.6 3.1*   No  results for input(s): LIPASE, AMYLASE in the last 168 hours. No results for input(s): AMMONIA in the last 168 hours. CBC:  Recent Labs Lab 06/10/15 2109 06/11/15 1749  WBC 21.8* 10.9*  NEUTROABS  --  8.0*  HGB 14.8 12.2  HCT 41.1 33.4*  MCV 81.1 79.3  PLT 324 253   Cardiac Enzymes: No results for input(s): CKTOTAL, CKMB, CKMBINDEX, TROPONINI in the last 168 hours. BNP (last 3 results) No results for input(s): BNP in the last 8760 hours.  ProBNP (last 3 results) No results for input(s): PROBNP in the last 8760 hours.  CBG: No results for input(s): GLUCAP in the last 168 hours.  Recent Results (from the past 240 hour(s))  MRSA PCR Screening     Status: None   Collection Time: 06/11/15 10:40 PM  Result Value Ref Range Status   MRSA by PCR NEGATIVE NEGATIVE Final    Comment:        The GeneXpert MRSA Assay (FDA approved for NASAL specimens only), is one component of a comprehensive MRSA colonization surveillance program. It is not intended to diagnose MRSA infection nor to guide or monitor treatment for MRSA infections.      Studies: Dg Chest 1 View  06/11/2015  CLINICAL DATA:  Pneumomediastinum.  Cocaine use EXAM: CHEST 1 VIEW COMPARISON:  June 10, 2015 chest radiograph; chest CT June 11, 2015 FINDINGS: The previously noted pneumomediastinum is again apparent, primarily in the superior mediastinum neck regions. Air is also seen in the right supraclavicular and left supraclavicular infraclavicular regions. There is no pneumothorax. The lungs are clear. The heart size and pulmonary vascularity are normal. No adenopathy. No bone lesions. IMPRESSION: Persistent pneumomediastinum. Lungs clear. No pneumothorax apparent. No change in cardiac silhouette. Electronically Signed   By: Bretta Bang III M.D.   On: 06/11/2015 07:10   Dg Chest 1 View  06/10/2015  CLINICAL DATA:  Hallucinations. EXAM: CHEST 1 VIEW COMPARISON:  None. FINDINGS: Linear gas collections in the  base of the neck and extending into the upper mediastinum consistent with pneumomediastinum. Cause is indeterminate. Possibilities would include penetrating injury, barotrauma, Valsalva, tracheal or esophageal injury. Normal heart size and pulmonary vascularity. No focal airspace disease or consolidation in the lungs. No blunting of costophrenic angles. No pneumothorax. Mediastinal contours appear intact. IMPRESSION: Pneumomediastinum with linear gas in the base of the neck. Etiology is indeterminate. These results were called by telephone at the time of interpretation on 06/10/2015 at 11:30 pm to Dr. Tilden Fossa , who verbally acknowledged these results. Electronically Signed   By: Burman Nieves M.D.   On: 06/10/2015 23:33   Ct Head Wo Contrast  06/10/2015  CLINICAL DATA:  Hallucinations and abrasions with lacerations of the face. EXAM: CT HEAD WITHOUT CONTRAST TECHNIQUE: Contiguous axial images were obtained from the base of the skull through the vertex without intravenous contrast. COMPARISON:  Chest radiograph from 06/10/2015 FINDINGS: There is gas tracking in the parapharyngeal spaces of the neck in an abnormal fashion. Looking at the chest radiograph, there appears to be pneumomediastinum and gas tracking in the neck as well. The brainstem, cerebellum, cerebral peduncles, thalamus, basal ganglia, basilar cisterns, and ventricular system appear within normal limits. No intracranial hemorrhage, mass lesion, or acute CVA. Mild chronic ethmoid sinusitis. Left medial  orbital wall concavity suggesting a remote medial orbital wall fracture. IMPRESSION: 1. Abnormal gas in the upper neck, tracking bilaterally along the parapharyngeal spaces. The origin of this gas is not certain. On the chest radiograph there is gas tracking in the neck and possibly in the upper mediastinum. Consider dedicated neck CT and/or chest CT for further workup. 2. Mild chronic ethmoid sinusitis. 3. Intracranial structures appear  unremarkable. 4. Suspected remote left medial orbital wall fracture. Electronically Signed   By: Gaylyn Rong M.D.   On: 06/10/2015 23:20   Ct Chest W Contrast  06/11/2015  CLINICAL DATA:  Patient presents with hallucinations. Pneumomediastinum demonstrated on chest radiograph. EXAM: CT CHEST, ABDOMEN, AND PELVIS WITH CONTRAST TECHNIQUE: Multidetector CT imaging of the chest, abdomen and pelvis was performed following the standard protocol during bolus administration of intravenous contrast. CONTRAST:  75mL OMNIPAQUE IOHEXOL 300 MG/ML  SOLN COMPARISON:  None. FINDINGS: CT CHEST FINDINGS Mediastinum/Lymph Nodes: No mass or lymphadenopathy. There is fairly extensive pneumomediastinum extending throughout the anterior, middle, and posterior mediastinum and along the posterior paraspinal soft tissues. Esophagus is decompressed. No mediastinal fluid collections. Lungs/Pleura: Patchy nodular infiltration in the left lingula and in the posterior right upper lung likely to represent pneumonia although parenchymal hemorrhage or contusion also possible. No pneumothorax. Musculoskeletal: No chest wall mass or suspicious bone lesions identified. No displaced rib fractures identified. CT ABDOMEN PELVIS FINDINGS Hepatobiliary: No masses or other significant abnormality. Pancreas: No mass, inflammatory changes, or other significant abnormality. Spleen: Within normal limits in size and appearance. Adrenals/Urinary Tract: No masses identified. No evidence of hydronephrosis. Stomach/Bowel: No evidence of obstruction, inflammatory process, or abnormal fluid collections. Vascular/Lymphatic: No pathologically enlarged lymph nodes. No evidence of abdominal aortic aneurysm. Reproductive: No mass or other significant abnormality. Other: None. Musculoskeletal:  No suspicious bone lesions identified. IMPRESSION: Extensive pneumo mediastinum demonstrated. Patchy nodular areas of infiltration in the left lingula and right upper lung  likely represent inflammatory process or pneumonia. Etiology of pneumo mediastinum is indeterminate although alveolar rupture is favored due to the other lung parenchymal process demonstrated. No acute process demonstrated in the abdomen or pelvis. Electronically Signed   By: Burman Nieves M.D.   On: 06/11/2015 00:27   Ct Cervical Spine Wo Contrast  06/11/2015  CLINICAL DATA:  Hallucinations. Concern for cervical spine injury. Initial encounter. EXAM: CT CERVICAL SPINE WITHOUT CONTRAST TECHNIQUE: Multidetector CT imaging of the cervical spine was performed without intravenous contrast. Multiplanar CT image reconstructions were also generated. COMPARISON:  None. FINDINGS: Diffuse soft tissue air is noted tracking along the neck, extending along the parapharyngeal fat planes, prevertebral space and tracking about the mediastinum and thyroid gland. This appears to arise from the chest, on concurrent chest CT images. There is no evidence of fracture or subluxation. Vertebral bodies demonstrate normal height and alignment. Intervertebral disc spaces are preserved. Prevertebral soft tissues are within normal limits. The visualized neural foramina are grossly unremarkable. The thyroid gland is unremarkable in appearance. The visualized lung apices are clear. No significant soft tissue abnormalities are seen. The visualized portions of the brain are unremarkable in appearance. IMPRESSION: 1. No evidence of fracture or subluxation along the cervical spine. 2. Diffuse soft tissue air noted tracking about the neck. On concurrent CT of the chest, this appears to arise from the chest. Electronically Signed   By: Roanna Raider M.D.   On: 06/11/2015 00:23   Ct Abdomen Pelvis W Contrast  06/11/2015  CLINICAL DATA:  Patient presents with hallucinations. Pneumomediastinum demonstrated on  chest radiograph. EXAM: CT CHEST, ABDOMEN, AND PELVIS WITH CONTRAST TECHNIQUE: Multidetector CT imaging of the chest, abdomen and pelvis  was performed following the standard protocol during bolus administration of intravenous contrast. CONTRAST:  75mL OMNIPAQUE IOHEXOL 300 MG/ML  SOLN COMPARISON:  None. FINDINGS: CT CHEST FINDINGS Mediastinum/Lymph Nodes: No mass or lymphadenopathy. There is fairly extensive pneumomediastinum extending throughout the anterior, middle, and posterior mediastinum and along the posterior paraspinal soft tissues. Esophagus is decompressed. No mediastinal fluid collections. Lungs/Pleura: Patchy nodular infiltration in the left lingula and in the posterior right upper lung likely to represent pneumonia although parenchymal hemorrhage or contusion also possible. No pneumothorax. Musculoskeletal: No chest wall mass or suspicious bone lesions identified. No displaced rib fractures identified. CT ABDOMEN PELVIS FINDINGS Hepatobiliary: No masses or other significant abnormality. Pancreas: No mass, inflammatory changes, or other significant abnormality. Spleen: Within normal limits in size and appearance. Adrenals/Urinary Tract: No masses identified. No evidence of hydronephrosis. Stomach/Bowel: No evidence of obstruction, inflammatory process, or abnormal fluid collections. Vascular/Lymphatic: No pathologically enlarged lymph nodes. No evidence of abdominal aortic aneurysm. Reproductive: No mass or other significant abnormality. Other: None. Musculoskeletal:  No suspicious bone lesions identified. IMPRESSION: Extensive pneumo mediastinum demonstrated. Patchy nodular areas of infiltration in the left lingula and right upper lung likely represent inflammatory process or pneumonia. Etiology of pneumo mediastinum is indeterminate although alveolar rupture is favored due to the other lung parenchymal process demonstrated. No acute process demonstrated in the abdomen or pelvis. Electronically Signed   By: Burman Nieves M.D.   On: 06/11/2015 00:27    Scheduled Meds: . azithromycin  500 mg Intravenous Q24H  . heparin  5,000  Units Subcutaneous 3 times per day  . piperacillin-tazobactam (ZOSYN)  IV  3.375 g Intravenous 3 times per day   Continuous Infusions: . sodium chloride 75 mL/hr at 06/11/15 2024    Principal Problem:   CAP (community acquired pneumonia) Active Problems:   Cocaine abuse   Psychoses   Pneumomediastinum (HCC)   Hallucination   Sepsis (HCC)   Hypokalemia   Aspiration pneumonia (HCC)    Time spent: 35 min    Jeralyn Bennett  [facility] Pager [phone]. If 7PM-7AM, please contact night-coverage at [email or web address], password [facility] 06/12/2015, 8:03 AM  LOS: 1 day

## 2015-06-12 NOTE — Progress Notes (Signed)
[facility]. Pt and alert and oriented X4. She whispers. Pt has multiple abrasions and ecchymotic areas on face, arms, legs, and hip. When asked how she got abrasions and bruises, she states, "I woke up and they were there." Pt is homeless and states she is not currently hallucinating. Instructed patient how to use call bell and turned bed alarm on.

## 2015-06-12 NOTE — ED Provider Notes (Signed)
Patient was originally evaluated by previous provider on 06/10/15. See full history and physical for details. Patient was awaiting placement when I was called to evaluate due to development of a fever. VS notable for T100.5, HR 114, O2 sat 97% on RA. I reviewed previous w/u with showed CTs chest notable for pneumomediastinum and infiltrate, inflammatory process versus pneumonia. Because of SIRS criteria and known source, initiated a code sepsis with ceftriaxone and azithromycin, blood and urine cultures, and IV fluid bolus. Gave patient Tylenol. On examination, the patient was resting comfortably with normal work of breathing. Diminished breath sounds bilaterally but no respiratory distress. Reviewed chart which shows a previous discussion with general surgery, Dr. Daphine DeutscherMartin, regarding pneumomediastinum. Dr. Daphine DeutscherMartin had recommended observation only.I discussed patient with [facility], Dr. Clyde LundborgNiu, and appreciate his assistance with the patient's care. [facility]  Laurence Spatesachel Morgan Kaylen Motl, MD 06/12/15 [phone]

## 2015-06-12 NOTE — Progress Notes (Addendum)
UR COMPLETED  
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
## 2015-06-13 DIAGNOSIS — F1410 Cocaine abuse, uncomplicated: Secondary | ICD-10-CM

## 2015-06-13 LAB — LEGIONELLA PNEUMOPHILA SEROGP 1 UR AG: L. PNEUMOPHILA SEROGP 1 UR AG: NEGATIVE

## 2015-06-13 LAB — BASIC METABOLIC PANEL
Anion gap: 9 (ref 5–15)
CHLORIDE: 108 mmol/L (ref 101–111)
CO2: 21 mmol/L — ABNORMAL LOW (ref 22–32)
Calcium: 8.2 mg/dL — ABNORMAL LOW (ref 8.9–10.3)
Creatinine, Ser: 0.7 mg/dL (ref 0.44–1.00)
GLUCOSE: 62 mg/dL — AB (ref 65–99)
Potassium: 3.2 mmol/L — ABNORMAL LOW (ref 3.5–5.1)
Sodium: 138 mmol/L (ref 135–145)

## 2015-06-13 LAB — CBC
HEMATOCRIT: 30.6 % — AB (ref 36.0–46.0)
HEMOGLOBIN: 10.7 g/dL — AB (ref 12.0–15.0)
MCH: 28.1 pg (ref 26.0–34.0)
MCHC: 35 g/dL (ref 30.0–36.0)
MCV: 80.3 fL (ref 78.0–100.0)
Platelets: 232 10*3/uL (ref 150–400)
RBC: 3.81 MIL/uL — ABNORMAL LOW (ref 3.87–5.11)
RDW: 14 % (ref 11.5–15.5)
WBC: 9.7 10*3/uL (ref 4.0–10.5)

## 2015-06-13 LAB — URINE CULTURE

## 2015-06-13 LAB — HEPATITIS PANEL, ACUTE
HEP A IGM: NEGATIVE
HEP B C IGM: NEGATIVE
Hepatitis B Surface Ag: NEGATIVE

## 2015-06-13 MED ORDER — POTASSIUM CHLORIDE CRYS ER 20 MEQ PO TBCR
40.0000 meq | EXTENDED_RELEASE_TABLET | Freq: Two times a day (BID) | ORAL | Status: AC
  Administered 2015-06-13 (×2): 40 meq via ORAL
  Filled 2015-06-13 (×2): qty 2

## 2015-06-13 MED ORDER — PIPERACILLIN-TAZOBACTAM 3.375 G IVPB
3.3750 g | Freq: Three times a day (TID) | INTRAVENOUS | Status: DC
  Administered 2015-06-14 (×2): 3.375 g via INTRAVENOUS
  Filled 2015-06-13 (×3): qty 50

## 2015-06-13 MED ORDER — MAGNESIUM SULFATE 2 GM/50ML IV SOLN
2.0000 g | Freq: Once | INTRAVENOUS | Status: AC
  Administered 2015-06-13: 2 g via INTRAVENOUS
  Filled 2015-06-13: qty 50

## 2015-06-13 NOTE — Progress Notes (Signed)
[facility] PROGRESS NOTE    Progress Note   Nancy Proctor ZOX:[id number] DOB: Oct 03, 1990 DOA: 06/10/2015 [facility]CP: [facility]   Brief Narrative:   Nancy Proctor is an 24 y.o. female surgical history of cocaine abuse and anemia that presents to the ED with a summation and altered mental status, CT scan of the chest shows extensive pneumomediastinum with patchy nodular area of infiltration in the left lingula and right upper lung. CT scan of the C-spine and head show sinusitis and a remote medial orbital wall fracture  Assessment/Plan:   Pneumomediastinum (HCC)/  Sepsis (HCC) due to CAP (community acquired pneumonia) Unclear etiology of pneumomediastinum, x-ray revealed no mediastinum with a linear gas at the base of the neck.  Water-soluble esophagogram showed no signs of perforation. She started empirically on Zosyn and azithromycin. Will discontinue azithromycin. She has remained afebrile leukocytosis is improved, her pro-calcitonin was elevated. We'll continue IV Zosyn. CT surgery to dictate diet.  Cocaine abuse - UDS negative she denies recetn cocaine abuse.  Psychoses/ Hallucination - Now resolved.  Hypokalemia - Mag cont to be low, we'll give magnesium IV continue to replete potassium orally.     DVT Prophylaxis - Lovenox ordered.  Family Communication: none Disposition Plan: Home when stable. Code Status:     Code Status Orders        Start     Ordered   06/11/15 1955  Full code   Continuous     06/11/15 1958        IV Access:    Peripheral IV   Procedures and diagnostic studies:   Dg Chest 2 View  06/12/2015  CLINICAL DATA:  COUGH AND WEAKNESS EXAM: CHEST  2 VIEW COMPARISON:  06/11/2015 FINDINGS: Normal heart size. No pleural effusion identified. New bilateral, lower lobe airspace opacities are identified. Upper lobes are clear. The visualized osseous structures are unremarkable. IMPRESSION: 1. Bilateral lower lobe airspace opacities  compatible with pneumonia. Electronically Signed   By: Signa Kell M.D.   On: 06/12/2015 09:15   Ct Chest Wo Contrast  06/12/2015  CLINICAL DATA:  Subsequent encounter for pneumomediastinum. Patient is status post upper GI series immediately prior to this exam. EXAM: CT CHEST WITHOUT CONTRAST TECHNIQUE: Multidetector CT imaging of the chest was performed following the standard protocol without IV contrast. COMPARISON:  Chest x-ray from earlier the same day. Chest CT from 06/11/2015. FINDINGS: Mediastinum / Lymph Nodes: There is no axillary lymphadenopathy. No mediastinal lymphadenopathy. There is no hilar lymphadenopathy. The heart size is normal. No pericardial effusion. Gas is identified in the soft tissues of the supraclavicular regions and mediastinum, as seen previously. Most of the gas is towards the upper (cranial) portion of the mediastinum and thorax. Smaller volume of mediastinal gas is seen near the distal esophagus. There is no evidence for contrast material in the extra esophageal mediastinal soft tissues. No evidence for substantial free fluid in the mediastinum. Lungs / Pleura: Lung windows show patchy airspace disease in the posterior right upper lobe with more confluent, right greater than left, airspace disease in the lower lobes. There is also some patchy airspace disease in the posterior lingula. Small right pleural effusion is associated. Upper Abdomen: Small area of low attenuation in the anterior liver, adjacent to the falciform ligament, is in a characteristic location for focal fatty change. MSK / Soft Tissues: Bone windows reveal no worrisome lytic or sclerotic osseous lesions. IMPRESSION: Pneumomediastinum without evidence for extraluminal mediastinal contrast material immediately status post water-soluble esophagram. Interval increase  in patchy bilateral airspace disease with areas of confluent airspace consolidation in both lower lobes on the current study. Electronically Signed    By: Kennith Center M.D.   On: 06/12/2015 12:25   Dg Esophagus W/water Sol Cm  06/12/2015  CLINICAL DATA:  24 year old female with history of unexplained pneumomediastinum. Evaluate for potential esophageal perforation. EXAM: ESOPHOGRAM/BARIUM SWALLOW TECHNIQUE: Single contrast examination was performed using  Omnipaque 300. FLUOROSCOPY TIME:  Fluoroscopy Time:  52 seconds Number of Acquired Images:  13 COMPARISON:  No priors. FINDINGS: Limited single contrast esophagram demonstrates no esophageal perforation. Esophagus is morphologically normal in appearance on this single contrast examination. Specifically, no evidence of esophageal mass, stricture, esophageal ring or hiatal hernia. IMPRESSION: 1. Negative for esophageal perforation. Electronically Signed   By: Trudie Reed M.D.   On: 06/12/2015 11:41     Medical Consultants:    None.  Anti-Infectives:   Anti-infectives    Start     Dose/Rate Route Frequency Ordered Stop   06/12/15 1800  cefTRIAXone (ROCEPHIN) 1 g in dextrose 5 % 50 mL IVPB  Status:  Discontinued     1 g 100 mL/hr over 30 Minutes Intravenous Every 24 hours 06/11/15 1723 06/11/15 2249   06/12/15 1800  azithromycin (ZITHROMAX) 500 mg in dextrose 5 % 250 mL IVPB     500 mg 250 mL/hr over 60 Minutes Intravenous Every 24 hours 06/11/15 1723     06/12/15 0000  piperacillin-tazobactam (ZOSYN) IVPB 3.375 g     3.375 g 12.5 mL/hr over 240 Minutes Intravenous 3 times per day 06/11/15 2350     06/11/15 2000  azithromycin (ZITHROMAX) 500 mg in dextrose 5 % 250 mL IVPB  Status:  Discontinued     500 mg 250 mL/hr over 60 Minutes Intravenous Every 24 hours 06/11/15 1953 06/11/15 2007   06/11/15 1730  cefTRIAXone (ROCEPHIN) 1 g in dextrose 5 % 50 mL IVPB     1 g 100 mL/hr over 30 Minutes Intravenous  Once 06/11/15 1717 06/11/15 1838   06/11/15 1730  azithromycin (ZITHROMAX) 500 mg in dextrose 5 % 250 mL IVPB     500 mg 250 mL/hr over 60 Minutes Intravenous  Once 06/11/15 1717  06/11/15 2023      Subjective:    Nancy Proctor she relates her breathing is better, and she talked today. As not had any recent episodes of hallucination.  Objective:    Filed Vitals:   06/12/15 1500 06/12/15 2051 06/13/15 0031 06/13/15 0602  BP:  119/75 115/62 121/72  Pulse:  92 97 100  Temp: 99 F (37.2 C) 98.6 F (37 C) 99.6 F (37.6 C) 99.3 F (37.4 C)  TempSrc: Oral Oral Oral Oral  Resp:  Height:      Weight:      SpO2:  99% 98% 94%    Intake/Output Summary (Last 24 hours) at 06/13/15 0809 Last data filed at 06/13/15 0601  Gross per 24 hour  Intake   2200 ml  Output   1000 ml  Net   1200 ml   Filed Weights   06/11/15 0757 06/11/15 2230  Weight: 58.968 kg (130 lb) 63.7 kg (140 lb 6.9 oz)    Exam: Gen:  NAD Cardiovascular:  RRR, No M/R/G Chest and lungs:  She has good air movement clear to auscultation. crepitations phase at the base of the lung. Abdomen:  Abdomen soft, NT/ND, + BS Extremities:  No C/E/C   Data Reviewed:  Labs: Basic Metabolic Panel:  Recent Labs Lab 06/10/15 2109 06/11/15 1749 06/13/15 0355  NA 138 136 138  K 3.6 2.9* 3.2*  CL 102 107 108  CO2 20* 22 21*  GLUCOSE 76 125* 62*  BUN 23* 8 <5*  CREATININE 1.17* 0.85 0.70  CALCIUM 9.7 8.4* 8.2*   GFR Estimated Creatinine Clearance: 95.8 mL/min (by C-G formula based on Cr of 0.7). Liver Function Tests:  Recent Labs Lab 06/10/15 2109 06/11/15 1749  AST 173* 89*  ALT 58* 41  ALKPHOS 61 52  BILITOT 1.7* 1.5*  PROT 8.2* 6.0*  ALBUMIN 4.6 3.1*   No results for input(s): LIPASE, AMYLASE in the last 168 hours. No results for input(s): AMMONIA in the last 168 hours. Coagulation profile  Recent Labs Lab 06/11/15 2016  INR 1.28    CBC:  Recent Labs Lab 06/10/15 2109 06/11/15 1749 06/13/15 0355  WBC 21.8* 10.9* 9.7  NEUTROABS  --  8.0*  --   HGB 14.8 12.2 10.7*  HCT 41.1 33.4* 30.6*  MCV 81.1 79.3 80.3  PLT 324 253 232   Cardiac Enzymes: No  results for input(s): CKTOTAL, CKMB, CKMBINDEX, TROPONINI in the last 168 hours. BNP (last 3 results) No results for input(s): PROBNP in the last 8760 hours. CBG: No results for input(s): GLUCAP in the last 168 hours. D-Dimer: No results for input(s): DDIMER in the last 72 hours. Hgb A1c: No results for input(s): HGBA1C in the last 72 hours. Lipid Profile: No results for input(s): CHOL, HDL, LDLCALC, TRIG, CHOLHDL, LDLDIRECT in the last 72 hours. Thyroid function studies: No results for input(s): TSH, T4TOTAL, T3FREE, THYROIDAB in the last 72 hours.  Invalid input(s): FREET3 Anemia work up: No results for input(s): VITAMINB12, FOLATE, FERRITIN, TIBC, IRON, RETICCTPCT in the last 72 hours. Sepsis Labs:  Recent Labs Lab 06/10/15 2109 06/11/15 1749 06/11/15 1812 06/11/15 2016 06/13/15 0355  PROCALCITON  --   --   --  <0.10  --   WBC 21.8* 10.9*  --   --  9.7  LATICACIDVEN  --   --  0.77 0.7  --    Microbiology Recent Results (from the past 240 hour(s))  Blood Culture (routine x 2)     Status: None (Preliminary result)   Collection Time: 06/11/15  5:50 PM  Result Value Ref Range Status   Specimen Description BLOOD LEFT ANTECUBITAL  Final   Special Requests BOTTLES DRAWN AEROBIC AND ANAEROBIC  Final   Culture   Final    NO GROWTH < 24 HOURS Performed at [facility]    Report Status PENDING  Incomplete  Blood Culture (routine x 2)     Status: None (Preliminary result)   Collection Time: 06/11/15  5:57 PM  Result Value Ref Range Status   Specimen Description BLOOD RIGHT ANTECUBITAL  Final   Special Requests BOTTLES DRAWN AEROBIC AND ANAEROBIC  Final   Culture   Final    NO GROWTH < 24 HOURS Performed at [facility]    Report Status PENDING  Incomplete  Urine culture     Status: None (Preliminary result)   Collection Time: 06/11/15 10:40 PM  Result Value Ref Range Status   Specimen Description URINE, RANDOM  Final   Special Requests NONE  Final     Culture TOO YOUNG TO READ  Final   Report Status PENDING  Incomplete  MRSA PCR Screening     Status: None   Collection Time: 06/11/15 10:40 PM  Result Value Ref Range Status   MRSA by PCR NEGATIVE NEGATIVE Final    Comment:        The GeneXpert MRSA Assay (FDA approved for NASAL specimens only), is one component of a comprehensive MRSA colonization surveillance program. It is not intended to diagnose MRSA infection nor to guide or monitor treatment for MRSA infections.      Medications:   . azithromycin  500 mg Intravenous Q24H  . heparin  5,000 Units Subcutaneous 3 times per day  . piperacillin-tazobactam (ZOSYN)  IV  3.375 g Intravenous 3 times per day  . potassium chloride  40 mEq Oral BID   Continuous Infusions: . sodium chloride 75 mL/hr at 06/13/15 0100    Time spent: 25 min   LOS: 2 days   Marinda ElkFELIZ ORTIZ, ABRAHAM  [facility] Pager [phone]  *Please refer to [email or web address], password [email or web address] to get updated schedule on who will round on this patient, as hospitalists switch teams weekly. If 7PM-7AM, please contact night-coverage at [email or web address], password [email or web address] for any overnight needs.  06/13/2015, 8:09 AM

## 2015-06-13 NOTE — Progress Notes (Addendum)
      [address]       [place] [id number]             [phone]      Subjective:  Ms. Nancy Proctor denies pain this morning.  She is more alert.  She states she doesn't really have pain, but she can only sit up with a her head a certain way.  Objective: Vital signs in last 24 hours: Temp:  [98.6 F (37 C)-99.6 F (37.6 C)] 99.3 F (37.4 C) (11/24 0602) Pulse Rate:  [92-100] 100 (11/24 0602) Cardiac Rhythm:  [-] Sinus tachycardia (11/24 0700) Resp:  [20-24] 22 (11/24 0602) BP: (115-122)/(62-78) 121/72 mmHg (11/24 0602) SpO2:  [94 %-100 %] 94 % (11/24 0602)  Intake/Output from previous day: 11/23 0701 - 11/24 0700 In: 2200 [I.V.:1800; IV Piggyback:400] Out: 1000 [Urine:1000]  General appearance: alert, cooperative and no distress Heart: regular rate and rhythm Lungs: clear to auscultation bilaterally Abdomen: soft, non-tender; bowel sounds normal; no masses,  no organomegaly  Lab Results:  Recent Labs  06/11/15 1749 06/13/15 0355  WBC 10.9* 9.7  HGB 12.2 10.7*  HCT 33.4* 30.6*  PLT 253 232   BMET:  Recent Labs  06/11/15 1749 06/13/15 0355  NA 136 138  K 2.9* 3.2*  CL 107 108  CO2 22 21*  GLUCOSE 125* 62*  BUN 8 <5*  CREATININE 0.85 0.70  CALCIUM 8.4* 8.2*    PT/INR:  Recent Labs  06/11/15 2016  LABPROT 16.1*  INR 1.28   ABG No results found for: PHART, HCO3, TCO2, ACIDBASEDEF, O2SAT CBG (last 3)  No results for input(s): GLUCAP in the last 72 hours.  Assessment/Plan:  1. Pneumomediastinum- repeat CXR in AM 2. Dispo- follow up Esophagram and CT scan do not show evidence of esophageal injury... May start a diet, will follow intermittently, please call if further assistance is needed   LOS: 2 days    BARRETT, ERIN 06/13/2015  I have seen and examined Nancy Proctor and agree with the above assessment  and plan.  Delight OvensEdward B Juaquin Ludington MD [facility] [phone] Office [phone] 06/13/2015 9:12 AM

## 2015-06-14 LAB — BASIC METABOLIC PANEL
Anion gap: 5 (ref 5–15)
CO2: 26 mmol/L (ref 22–32)
CREATININE: 0.67 mg/dL (ref 0.44–1.00)
Calcium: 8.3 mg/dL — ABNORMAL LOW (ref 8.9–10.3)
Chloride: 107 mmol/L (ref 101–111)
Glucose, Bld: 104 mg/dL — ABNORMAL HIGH (ref 65–99)
POTASSIUM: 3.2 mmol/L — AB (ref 3.5–5.1)
SODIUM: 138 mmol/L (ref 135–145)

## 2015-06-14 LAB — MAGNESIUM: MAGNESIUM: 2 mg/dL (ref 1.7–2.4)

## 2015-06-14 LAB — RHEUMATOID FACTOR

## 2015-06-14 MED ORDER — POTASSIUM CHLORIDE CRYS ER 20 MEQ PO TBCR
40.0000 meq | EXTENDED_RELEASE_TABLET | Freq: Two times a day (BID) | ORAL | Status: AC
  Administered 2015-06-14 (×2): 40 meq via ORAL
  Filled 2015-06-14 (×2): qty 2

## 2015-06-14 MED ORDER — LEVOFLOXACIN 750 MG PO TABS
750.0000 mg | ORAL_TABLET | Freq: Every day | ORAL | Status: DC
  Administered 2015-06-14 – 2015-06-15 (×2): 750 mg via ORAL
  Filled 2015-06-14 (×2): qty 1

## 2015-06-14 NOTE — Progress Notes (Signed)
[facility] PROGRESS NOTE    Progress Note   Nancy FendJuana Konczal YQM:[id number] DOB: 01/31/1991 DOA: 06/10/2015 [facility]: [facility]   Brief Narrative:   Nancy Proctor is an 24 y.o. female surgical history of cocaine abuse and anemia that presents to the ED with a summation and altered mental status, CT scan of the chest shows extensive pneumomediastinum with patchy nodular area of infiltration in the left lingula and right upper lung. CT scan of the C-spine and head show sinusitis and a remote medial orbital wall fracture  Assessment/Plan:   Pneumomediastinum (HCC)/  Sepsis (HCC) due to CAP (community acquired pneumonia) Water-soluble esophagogram showed no signs of perforation. She started empirically on Zosyn and azithromycin. Deescalated antibiotic treatment to Levaquin. Cont ibuprofen.  Cocaine abuse - UDS negative she denies recetn cocaine abuse.  Psychoses/ Hallucination - Now resolved.  Hypokalemia - Mag cont to be low, we'll give magnesium IV continue to replete potassium orally.     DVT Prophylaxis - Lovenox ordered.  Family Communication: none Disposition Plan: Home in am Code Status:     Code Status Orders        Start     Ordered   06/11/15 1955  Full code   Continuous     06/11/15 1958        IV Access:    Peripheral IV   Procedures and diagnostic studies:   No results found.   Medical Consultants:    None.  Anti-Infectives:   Anti-infectives    Start     Dose/Rate Route Frequency Ordered Stop   06/14/15 0200  piperacillin-tazobactam (ZOSYN) IVPB 3.375 g     3.375 g 12.5 mL/hr over 240 Minutes Intravenous Every 8 hours 06/13/15 1745     06/12/15 1800  cefTRIAXone (ROCEPHIN) 1 g in dextrose 5 % 50 mL IVPB  Status:  Discontinued     1 g 100 mL/hr over 30 Minutes Intravenous Every 24 hours 06/11/15 1723 06/11/15 2249   06/12/15 1800  azithromycin (ZITHROMAX) 500 mg in dextrose 5 % 250 mL IVPB  Status:  Discontinued     500  mg 250 mL/hr over 60 Minutes Intravenous Every 24 hours 06/11/15 1723 06/13/15 0815   06/12/15 0000  piperacillin-tazobactam (ZOSYN) IVPB 3.375 g  Status:  Discontinued     3.375 g 12.5 mL/hr over 240 Minutes Intravenous 3 times per day 06/11/15 2350 06/13/15 1745   06/11/15 2000  azithromycin (ZITHROMAX) 500 mg in dextrose 5 % 250 mL IVPB  Status:  Discontinued     500 mg 250 mL/hr over 60 Minutes Intravenous Every 24 hours 06/11/15 1953 06/11/15 2007   06/11/15 1730  cefTRIAXone (ROCEPHIN) 1 g in dextrose 5 % 50 mL IVPB     1 g 100 mL/hr over 30 Minutes Intravenous  Once 06/11/15 1717 06/11/15 1838   06/11/15 1730  azithromycin (ZITHROMAX) 500 mg in dextrose 5 % 250 mL IVPB     500 mg 250 mL/hr over 60 Minutes Intravenous  Once 06/11/15 1717 06/11/15 2023      Subjective:    Nancy FendJuana Fambro feels better. With some chest discomfort.  Objective:    Filed Vitals:   06/13/15 2019 06/13/15 2114 06/14/15 0434 06/14/15 1023  BP: 107/68 118/68 100/68 107/65  Pulse:  100 78 91  Temp: 99.5 F (37.5 C) 99.5 F (37.5 C) 98.6 F (37 C) 98.4 F (36.9 C)  TempSrc: Oral  Oral Oral  Resp: 26 21 20 18   Height:  Weight:  65.318 kg (144 lb)    SpO2:  100% 98% 100%    Intake/Output Summary (Last 24 hours) at 06/14/15 1228 Last data filed at 06/13/15 1751  Gross per 24 hour  Intake      0 ml  Output   1275 ml  Net  -1275 ml   Filed Weights   06/11/15 0757 06/11/15 2230 06/13/15 2114  Weight: 58.968 kg (130 lb) 63.7 kg (140 lb 6.9 oz) 65.318 kg (144 lb)    Exam: Gen:  NAD Cardiovascular:  RRR, chest pain with disability by palpation Chest and lungs:  She has good air movement clear to auscultation. No crepitations phase at the base of the lung. Abdomen:  Abdomen soft, NT/ND, + BS Extremities:  No C/E/C   Data Reviewed:    Labs: Basic Metabolic Panel:  Recent Labs Lab 06/10/15 2109 06/11/15 1749 06/13/15 0355 06/14/15 0402  NA 138 136 138 138  K 3.6 2.9* 3.2* 3.2*   CL 102 107 108 107  CO2 20* 22 21* 26  GLUCOSE 76 125* 62* 104*  BUN 23* 8 <5* <5*  CREATININE 1.17* 0.85 0.70 0.67  CALCIUM 9.7 8.4* 8.2* 8.3*  MG  --   --   --  2.0   GFR Estimated Creatinine Clearance: 97 mL/min (by C-G formula based on Cr of 0.67). Liver Function Tests:  Recent Labs Lab 06/10/15 2109 06/11/15 1749  AST 173* 89*  ALT 58* 41  ALKPHOS 61 52  BILITOT 1.7* 1.5*  PROT 8.2* 6.0*  ALBUMIN 4.6 3.1*   No results for input(s): LIPASE, AMYLASE in the last 168 hours. No results for input(s): AMMONIA in the last 168 hours. Coagulation profile  Recent Labs Lab 06/11/15 2016  INR 1.28    CBC:  Recent Labs Lab 06/10/15 2109 06/11/15 1749 06/13/15 0355  WBC 21.8* 10.9* 9.7  NEUTROABS  --  8.0*  --   HGB 14.8 12.2 10.7*  HCT 41.1 33.4* 30.6*  MCV 81.1 79.3 80.3  PLT 324 253 232   Cardiac Enzymes: No results for input(s): CKTOTAL, CKMB, CKMBINDEX, TROPONINI in the last 168 hours. BNP (last 3 results) No results for input(s): PROBNP in the last 8760 hours. CBG: No results for input(s): GLUCAP in the last 168 hours. D-Dimer: No results for input(s): DDIMER in the last 72 hours. Hgb A1c: No results for input(s): HGBA1C in the last 72 hours. Lipid Profile: No results for input(s): CHOL, HDL, LDLCALC, TRIG, CHOLHDL, LDLDIRECT in the last 72 hours. Thyroid function studies: No results for input(s): TSH, T4TOTAL, T3FREE, THYROIDAB in the last 72 hours.  Invalid input(s): FREET3 Anemia work up: No results for input(s): VITAMINB12, FOLATE, FERRITIN, TIBC, IRON, RETICCTPCT in the last 72 hours. Sepsis Labs:  Recent Labs Lab 06/10/15 2109 06/11/15 1749 06/11/15 1812 06/11/15 2016 06/13/15 0355  PROCALCITON  --   --   --  <0.10  --   WBC 21.8* 10.9*  --   --  9.7  LATICACIDVEN  --   --  0.77 0.7  --    Microbiology Recent Results (from the past 240 hour(s))  Blood Culture (routine x 2)     Status: None (Preliminary result)   Collection Time:  06/11/15  5:50 PM  Result Value Ref Range Status   Specimen Description BLOOD LEFT ANTECUBITAL  Final   Special Requests BOTTLES DRAWN AEROBIC AND ANAEROBIC  Final   Culture   Final    NO GROWTH 2 DAYS Performed at [facility]    Report Status PENDING  Incomplete  Blood Culture (routine x 2)     Status: None (Preliminary result)   Collection Time: 06/11/15  5:57 PM  Result Value Ref Range Status   Specimen Description BLOOD RIGHT ANTECUBITAL  Final   Special Requests BOTTLES DRAWN AEROBIC AND ANAEROBIC  Final   Culture   Final    NO GROWTH 2 DAYS Performed at [facility]    Report Status PENDING  Incomplete  Urine culture     Status: None   Collection Time: 06/11/15 10:40 PM  Result Value Ref Range Status   Specimen Description URINE, RANDOM  Final   Special Requests NONE  Final   Culture MULTIPLE SPECIES PRESENT, SUGGEST RECOLLECTION  Final   Report Status 06/13/2015 FINAL  Final  MRSA PCR Screening     Status: None   Collection Time: 06/11/15 10:40 PM  Result Value Ref Range Status   MRSA by PCR NEGATIVE NEGATIVE Final    Comment:        The GeneXpert MRSA Assay (FDA approved for NASAL specimens only), is one component of a comprehensive MRSA colonization surveillance program. It is not intended to diagnose MRSA infection nor to guide or monitor treatment for MRSA infections.      Medications:   . heparin  5,000 Units Subcutaneous 3 times per day  . piperacillin-tazobactam (ZOSYN)  IV  3.375 g Intravenous Q8H   Continuous Infusions: . sodium chloride 75 mL/hr at 06/13/15 0100    Time spent: 25 min   LOS: 3 days   Marinda Elk  [facility] Pager [phone]  *Please refer to [email or web address], password [email or web address] to get updated schedule on who will round on this patient, as hospitalists switch teams weekly. If 7PM-7AM, please contact night-coverage at [email or web address], password [email or web address] for any overnight needs.  06/14/2015, 12:28 PM

## 2015-06-15 LAB — BASIC METABOLIC PANEL
Anion gap: 4 — ABNORMAL LOW (ref 5–15)
CALCIUM: 8.7 mg/dL — AB (ref 8.9–10.3)
CO2: 27 mmol/L (ref 22–32)
CREATININE: 0.63 mg/dL (ref 0.44–1.00)
Chloride: 107 mmol/L (ref 101–111)
GFR calc Af Amer: >60 mL/min (ref >60)
Glucose, Bld: 101 mg/dL — ABNORMAL HIGH (ref 65–99)
Potassium: 4 mmol/L (ref 3.5–5.1)
SODIUM: 138 mmol/L (ref 135–145)

## 2015-06-15 LAB — EXPECTORATED SPUTUM ASSESSMENT W REFEX TO RESP CULTURE

## 2015-06-15 LAB — EXPECTORATED SPUTUM ASSESSMENT W GRAM STAIN, RFLX TO RESP C

## 2015-06-15 MED ORDER — ACETAMINOPHEN 325 MG PO TABS: 650.0000 mg | ORAL_TABLET | ORAL | Status: DC | PRN

## 2015-06-15 MED ORDER — LEVOFLOXACIN 750 MG PO TABS: 750.0000 mg | ORAL_TABLET | Freq: Every day | ORAL | Status: DC
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
## 2015-06-15 NOTE — Progress Notes (Signed)
      [address]       [place] [id number]             [phone]      Subjective:  No new complaints... No further hallucinations  Objective: Vital signs in last 24 hours: Temp:  [98 F (36.7 C)-98.4 F (36.9 C)] 98.2 F (36.8 C) (11/26 0538) Pulse Rate:  [74-91] 74 (11/26 0538) Cardiac Rhythm:  [-] Normal sinus rhythm (11/26 0715) Resp:  [18] 18 (11/26 0538) BP: (106-110)/(65-72) 107/66 mmHg (11/26 0538) SpO2:  [100 %] 100 % (11/26 0538) Weight:  [146 lb 3.1 oz (66.314 kg)] 146 lb 3.1 oz (66.314 kg) (11/25 2112)  Intake/Output from previous day: 11/25 0701 - 11/26 0700 In: 580 [P.O.:580] Out: 225 [Urine:225]  General appearance: alert, cooperative and no distress Heart: regular rate and rhythm Lungs: clear to auscultation bilaterally  Lab Results:  Recent Labs  06/13/15 0355  WBC 9.7  HGB 10.7*  HCT 30.6*  PLT 232   BMET:  Recent Labs  06/14/15 0402 06/15/15 0558  NA 138 138  K 3.2* 4.0  CL 107 107  CO2 26 27  GLUCOSE 104* 101*  BUN <5* <5*  CREATININE 0.67 0.63  CALCIUM 8.3* 8.7*    PT/INR: No results for input(s): LABPROT, INR in the last 72 hours. ABG No results found for: PHART, HCO3, TCO2, ACIDBASEDEF, O2SAT CBG (last 3)  No results for input(s): GLUCAP in the last 72 hours.  Assessment/Plan:  1. Pneumomediastinum- will need repeat CXR in 2 weeks, our office will contact with appointment date and time    LOS: 4 days    Raford PitcherBARRETT, [facility] 06/15/2015

## 2015-06-15 NOTE — Progress Notes (Signed)
Nancy FendJuana Proctor to be D/C'd Home per MD order.  Discussed prescriptions and follow up appointments with the patient. Prescriptions given to patient, medication list explained in detail. Pt verbalized understanding.    Medication List    TAKE these medications        acetaminophen 325 MG tablet  Commonly known as:  TYLENOL  Take 2 tablets (650 mg total) by mouth every 4 (four) hours as needed for mild pain or fever (T > 38.3Celsius).     levofloxacin 750 MG tablet  Commonly known as:  LEVAQUIN  Take 1 tablet (750 mg total) by mouth daily.        Filed Vitals:   06/14/15 2112 06/15/15 0538  BP: 110/72 107/66  Pulse: 88 74  Temp: 98.3 F (36.8 C) 98.2 F (36.8 C)  Resp: 18 18    Skin clean, dry and intact without evidence of skin break down, no evidence of skin tears noted. IV catheter discontinued intact. Site without signs and symptoms of complications. Dressing and pressure applied. Pt denies pain at this time. No complaints noted. Information provided for outpatient mental health resource.   An After Visit Summary was printed and given to the patient. Patient escorted via WC, and D/C home via private auto.  Nancy ForehandLuke Staisha Proctor BSN, RN

## 2015-06-16 LAB — CULTURE, BLOOD (ROUTINE X 2)
Culture: NO GROWTH
Culture: NO GROWTH

## 2015-06-17 LAB — ANTINUCLEAR ANTIBODIES, IFA: ANTINUCLEAR ANTIBODIES, IFA: NEGATIVE

## 2015-06-18 LAB — CULTURE, RESPIRATORY W GRAM STAIN: Culture: NORMAL

## 2015-06-18 LAB — CULTURE, RESPIRATORY

## 2015-06-28 ENCOUNTER — Other Ambulatory Visit: Payer: Self-pay | Admitting: Thoracic Surgery (Cardiothoracic Vascular Surgery)

## 2015-06-28 DIAGNOSIS — J982 Interstitial emphysema: Secondary | ICD-10-CM

## 2015-07-01 ENCOUNTER — Ambulatory Visit: Payer: Medicaid Other | Admitting: Thoracic Surgery (Cardiothoracic Vascular Surgery)

## 2015-07-05 ENCOUNTER — Other Ambulatory Visit: Payer: Self-pay | Admitting: Cardiothoracic Surgery

## 2015-07-05 DIAGNOSIS — J982 Interstitial emphysema: Secondary | ICD-10-CM

## 2015-07-08 ENCOUNTER — Ambulatory Visit: Payer: Medicaid Other

## 2015-07-08 ENCOUNTER — Ambulatory Visit: Payer: Medicaid Other | Admitting: Thoracic Surgery (Cardiothoracic Vascular Surgery)

## 2016-03-16 ENCOUNTER — Encounter (HOSPITAL_COMMUNITY): Payer: Self-pay

## 2016-03-16 ENCOUNTER — Emergency Department (HOSPITAL_COMMUNITY)
Admission: EM | Admit: 2016-03-16 | Discharge: 2016-03-16 | Disposition: A | Discharge status: Home / Self Care | Payer: Medicaid Other | Attending: Emergency Medicine | Admitting: Emergency Medicine

## 2016-03-16 DIAGNOSIS — F1490 Cocaine use, unspecified, uncomplicated: Secondary | ICD-10-CM | POA: Insufficient documentation

## 2016-03-16 DIAGNOSIS — R443 Hallucinations, unspecified: Secondary | ICD-10-CM

## 2016-03-16 DIAGNOSIS — F419 Anxiety disorder, unspecified: Secondary | ICD-10-CM | POA: Insufficient documentation

## 2016-03-16 DIAGNOSIS — Z79899 Other long term (current) drug therapy: Secondary | ICD-10-CM | POA: Insufficient documentation

## 2016-03-16 DIAGNOSIS — Z91010 Allergy to peanuts: Secondary | ICD-10-CM | POA: Insufficient documentation

## 2016-03-16 DIAGNOSIS — R21 Rash and other nonspecific skin eruption: Secondary | ICD-10-CM

## 2016-03-16 MED ORDER — EUCERIN EX LOTN: TOPICAL_LOTION | CUTANEOUS | 0 refills | Status: DC | PRN

## 2016-03-16 MED ORDER — IBUPROFEN 200 MG PO TABS
600.0000 mg | ORAL_TABLET | Freq: Once | ORAL | Status: AC
  Administered 2016-03-16: 600 mg via ORAL
  Filled 2016-03-16: qty 3

## 2016-03-16 MED ORDER — IBUPROFEN 600 MG PO TABS: 600.0000 mg | ORAL_TABLET | Freq: Four times a day (QID) | ORAL | 0 refills | Status: DC | PRN
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
## 2016-03-16 NOTE — ED Provider Notes (Signed)
[facility]-EMERGENCY DEPT Provider Note   CSN: [id number] Arrival date & time: 03/16/16  2036     History   Chief Complaint Chief Complaint  Patient presents with  . Insect Bite   HPI   Nancy Proctor is an 25 y.o. female with history of psychosis, cocaine abuse, schizoaffective disorder who presents to the ED for evaluation of insect bite to her right leg. She states she thinks she got bit by something that is now causing her right foot to swell and "the muscles are moving." she states her right foot feels "tight" and at times itches. She is not sure when the symptoms started. She denies recent drug use. She states people tell her she is hallucinating but she "is not crazy." She states she does not "want to go to the psych ward." She specifically denies suicidal or homicidal ideations. She states she is just here to get help with her insect bites because they are causing the muscles in her foot to keep moving around.   Past Medical History:  Diagnosis Date  . Anemia   . Cocaine abuse     Patient Active Problem List   Diagnosis Date Noted  . Pneumomediastinum (HCC) 06/11/2015  . Hallucination 06/11/2015  . CAP (community acquired pneumonia) 06/11/2015  . Sepsis (HCC) 06/11/2015  . Hypokalemia 06/11/2015  . Aspiration pneumonia (HCC) 06/11/2015  . Acute psychosis   . Cocaine abuse   . Psychoses     History reviewed. No pertinent surgical history.  OB History    No data available       Home Medications    Prior to Admission medications   Medication Sig Start Date End Date Taking? Authorizing Provider  acetaminophen (TYLENOL) 325 MG tablet Take 2 tablets (650 mg total) by mouth every 4 (four) hours as needed for mild pain or fever (T > 38.3Celsius). 06/15/15   Marinda Elk, MD  levofloxacin (LEVAQUIN) 750 MG tablet Take 1 tablet (750 mg total) by mouth daily. 06/15/15   Marinda Elk, MD    Family History Family History  Problem Relation Age of Onset  .  Cancer Mother   . Hypertension Father   . Diabetes Other   . Heart attack Other     Social History Social History  Substance Use Topics  . Smoking status: Never Smoker  . Smokeless tobacco: Never Used  . Alcohol use Yes     Comment: occ     Allergies   Peanut butter flavor and Soy allergy   Review of Systems Review of Systems 10 Systems reviewed and are negative for acute change except as noted in the HPI.  Physical Exam Updated Vital Signs BP 149/97 (BP Location: Right Arm)   Pulse 109   Temp 97.6 F (36.4 C) (Oral)   Resp 20   LMP 03/02/2016   SpO2 100%   Physical Exam  Constitutional: She is oriented to person, place, and time. No distress.  HENT:  Head: Atraumatic.  Right Ear: External ear normal.  Left Ear: External ear normal.  Nose: Nose normal.  Eyes: Conjunctivae are normal. No scleral icterus.  Cardiovascular: Normal rate and regular rhythm.   Pulmonary/Chest: Effort normal. No respiratory distress.  Abdominal: She exhibits no distension.  Neurological: She is alert and oriented to person, place, and time.  Skin: Skin is warm and dry. She is not diaphoretic.  Bilateral LE with multiple areas of excoriation and scars. No bites, vesicles, pustules, or papules visible. Skin is dry  Psychiatric:  Her behavior is normal. Her mood appears anxious. She is actively hallucinating (points to foot and tells me she can see the muscles moving around). She expresses no homicidal and no suicidal ideation.  Nursing note and vitals reviewed.    ED Treatments / Results  Labs (all labs ordered are listed, but only abnormal results are displayed) Labs Reviewed - No data to display  EKG  EKG Interpretation None       Radiology No results found.  Procedures Procedures (including critical care time)  Medications Ordered in ED Medications - No data to display   Initial Impression / Assessment and Plan / ED Course  I have reviewed the triage vital signs and  the nursing notes.  Pertinent labs & imaging results that were available during my care of the patient were reviewed by me and considered in my medical decision making (see chart for details).  Clinical Course   Jeannie FendJuana Hoback is an 25 y.o. female with known psychiatric history presenting to the ED for evaluation of insect bites that she feels are causing her muscles to swell and move around. On exam she does have some areas of excoriation but no visible bites or other rash. Her legs are not swollen or tender. However, although she is hallucinating, I do not feel she is a threat to herself or others. She does not want a psychiatric evaluation. [facility] has [facility] in the past for hallucinations and without suicidal or homicidal ideations was deemed inappropriate for inpatient placement. She states she will follow up with an outpatient. I gave pt an ACE wrap which she states immediately made her muscles feel better. She does have some areas of very dry skin so I did prescribe eucerin lotion. Reminded her to avoid cocaine or other drug use and to f/u with outpatient psych and PCP.  Final Clinical Impressions(s) / ED Diagnoses   Final diagnoses:  Rash and nonspecific skin eruption  Hallucination    New Prescriptions Discharge Medication List as of 03/16/2016  9:49 PM    START taking these medications   Details  Emollient (EUCERIN) lotion Apply topically as needed., Starting Mon 03/16/2016, Print    ibuprofen (ADVIL,MOTRIN) 600 MG tablet Take 1 tablet (600 mg total) by mouth every 6 (six) hours as needed., Starting Mon 03/16/2016, Print         Carlene CoriaSerena Y Hye Trawick, PA-C 03/17/16 1335    Raeford RazorStephen Kohut, MD 03/28/16 [phone]

## 2016-03-16 NOTE — ED Notes (Signed)
Patient was alert, oriented and stable upon discharge. RN went over AVS and patient had no further questions.  

## 2016-04-08 IMAGING — DX DG CHEST 2V
2 series · 2 of 2 positions shown · non-contrast
Comparison: 06/11/2015

CLINICAL DATA: COUGH AND WEAKNESS

EXAM:
CHEST  2 VIEW

[x chest ap]
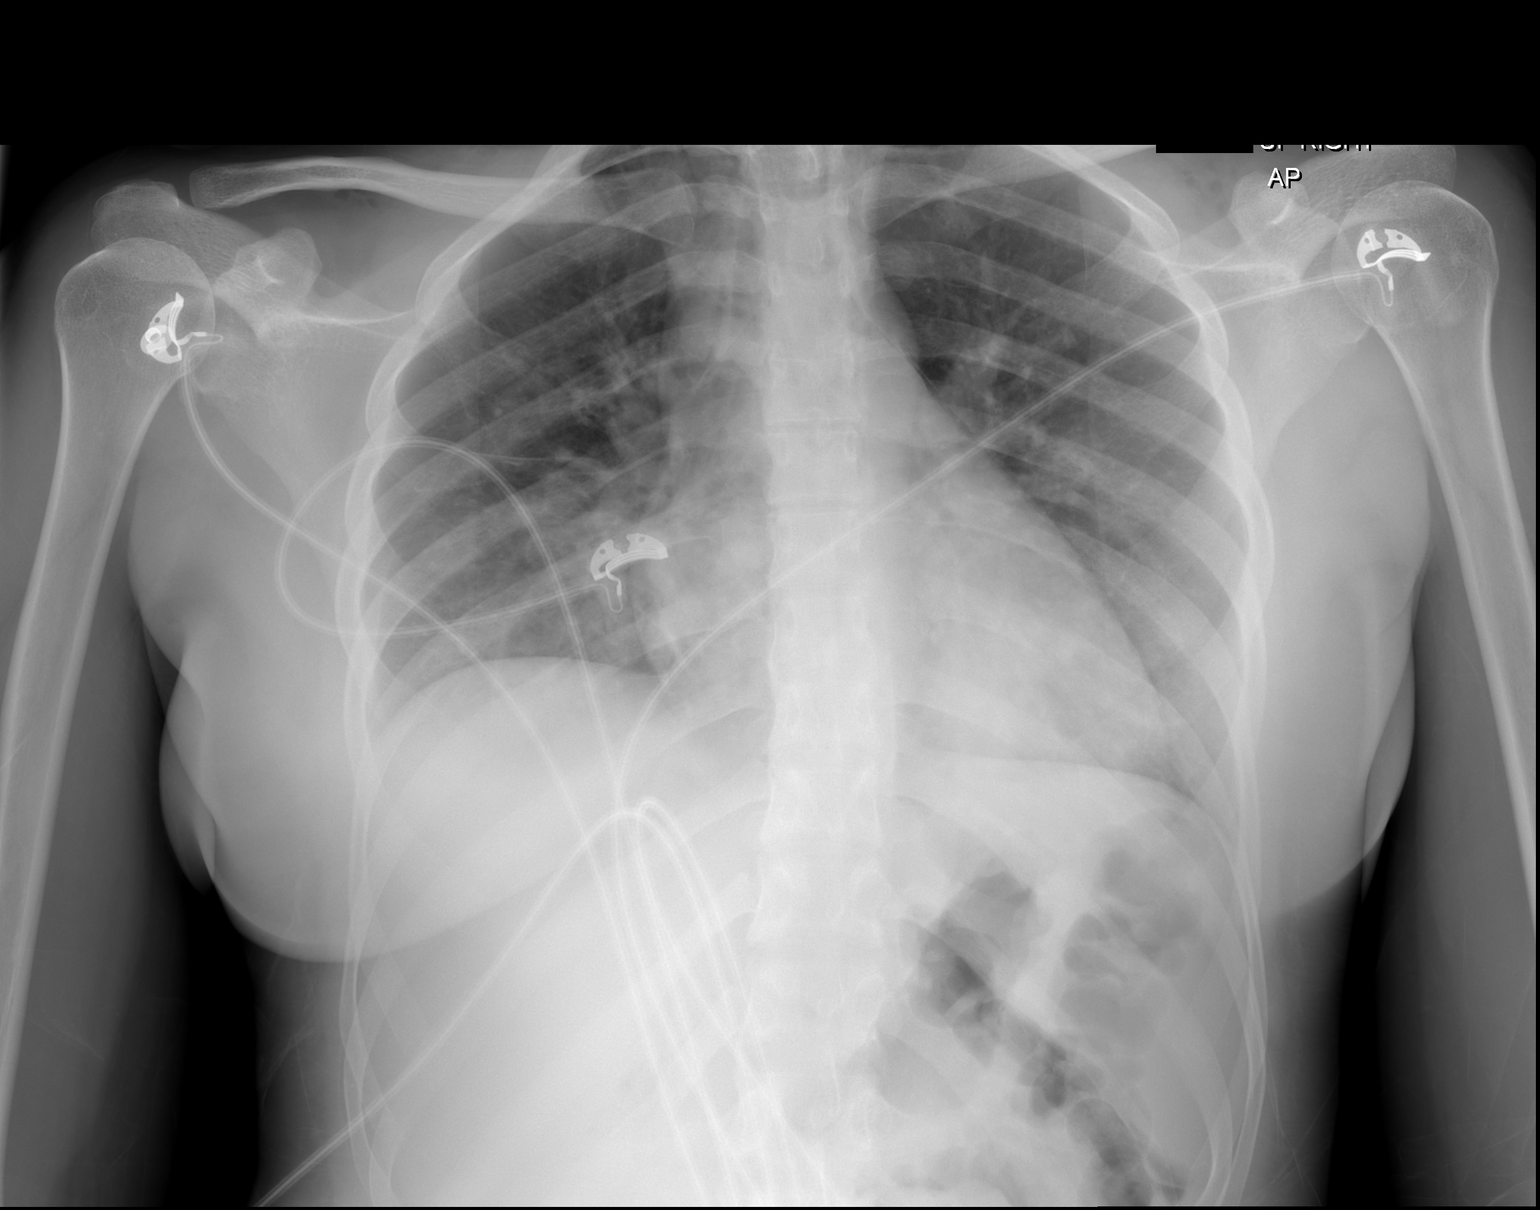

[w chest lat]
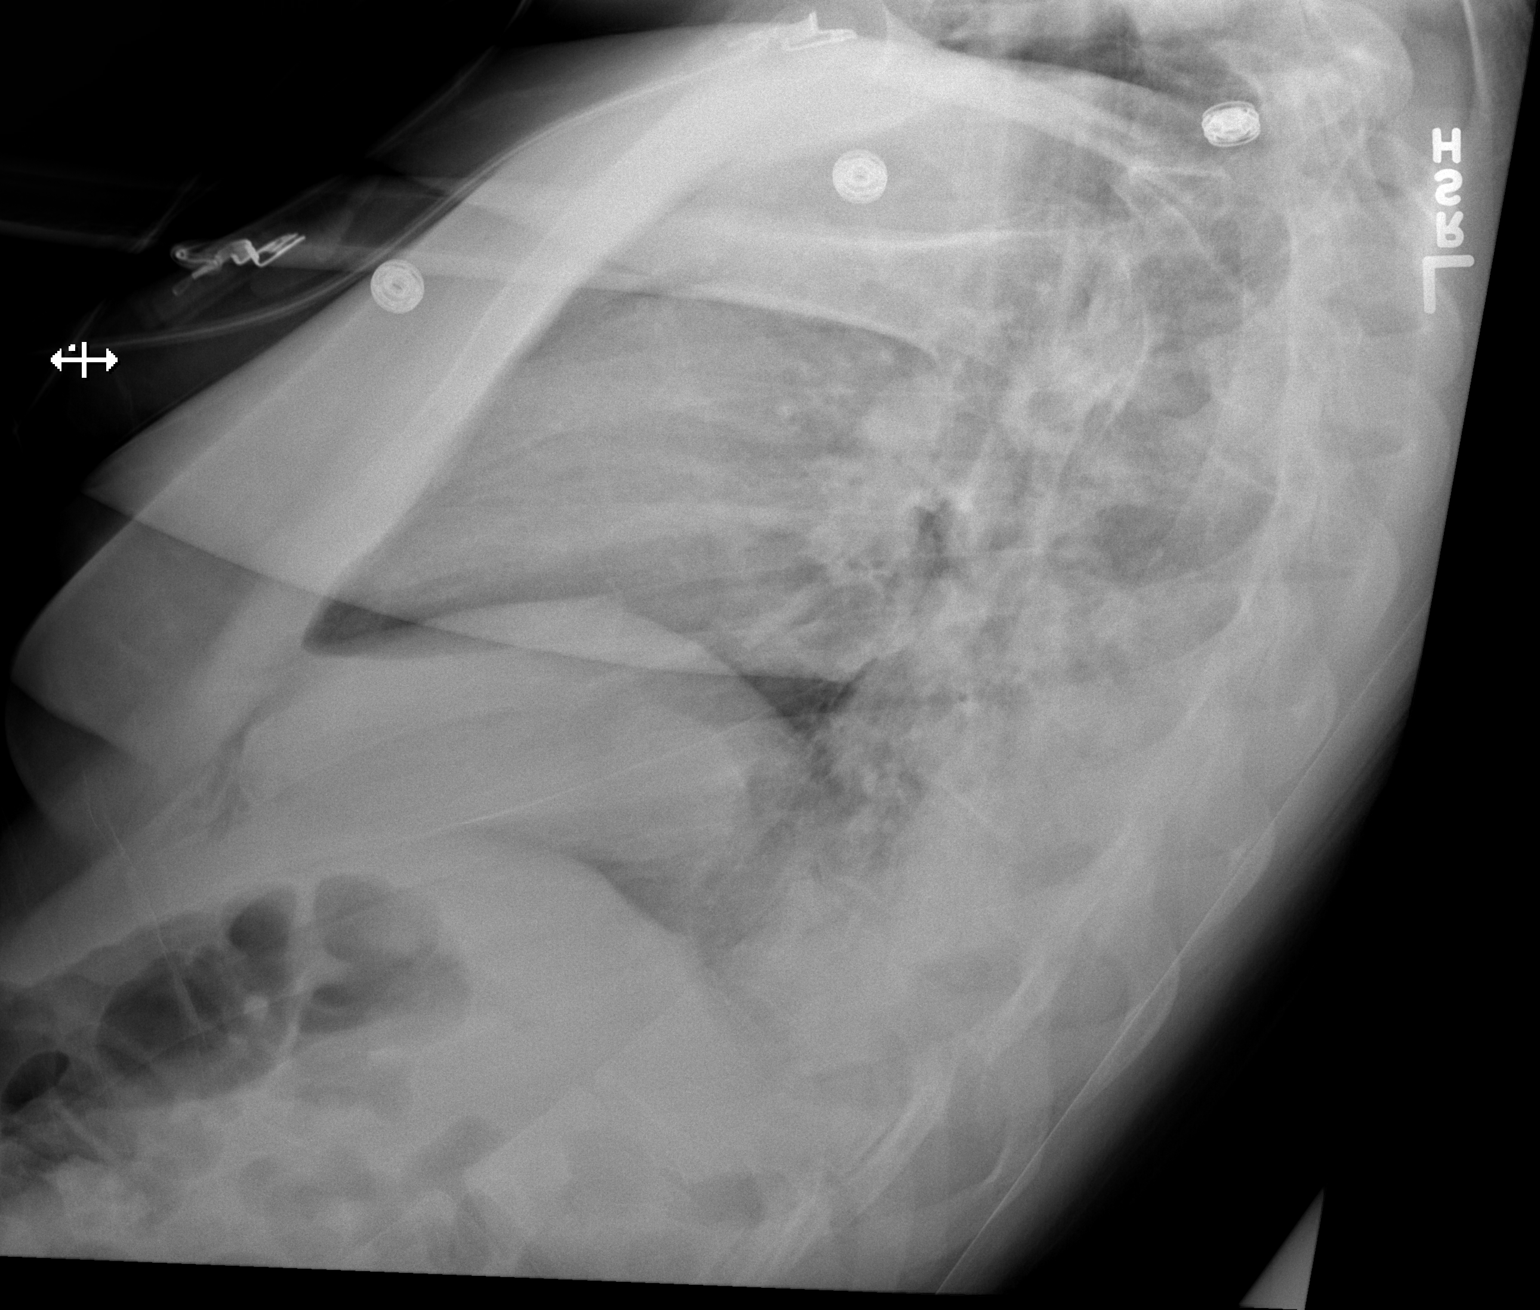

[2 of 2 positions shown; findings below may reference images not displayed]

FINDINGS: Normal heart size. No pleural effusion identified. New bilateral,
lower lobe airspace opacities are identified. Upper lobes are clear.
The visualized osseous structures are unremarkable.
IMPRESSION: 1. Bilateral lower lobe airspace opacities compatible with
pneumonia.

## 2016-04-08 IMAGING — RF DG ESOPHAGUS
13 series · 13 of 13 positions shown · IV contrast (omnipaque)
Comparison: No priors.

CLINICAL DATA: 24-year-old female with history of unexplained
pneumomediastinum. Evaluate for potential esophageal perforation.

EXAM:
ESOPHOGRAM/BARIUM SWALLOW
TECHNIQUE: Single contrast examination was performed using  Omnipaque 300.
FLUOROSCOPY TIME:  Fluoroscopy Time:  52 seconds
Number of Acquired Images:  13

[Series 1: run · 1 of 1 slices shown (1 of 13)]
[im 1/1]
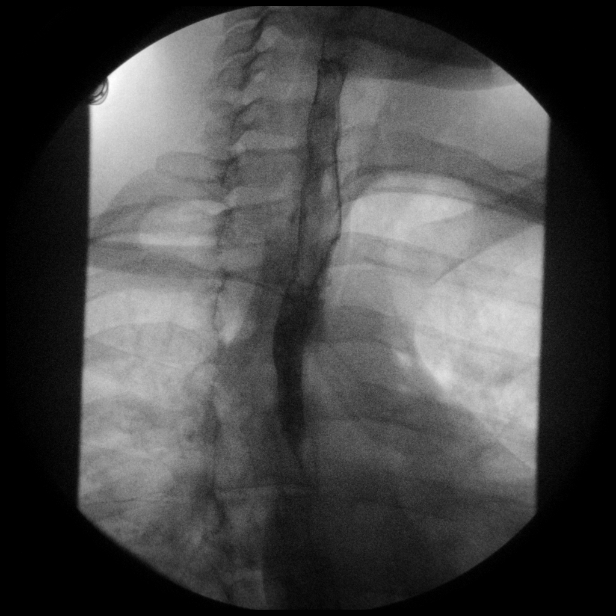

[Series 2: run · 1 of 1 slices shown (2 of 13)]
[im 1/1]
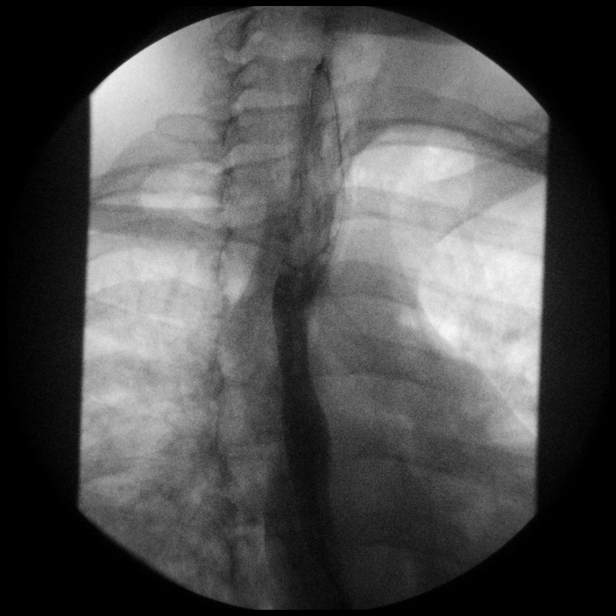

[Series 3: run · 1 of 1 slices shown (3 of 13)]
[im 1/1]
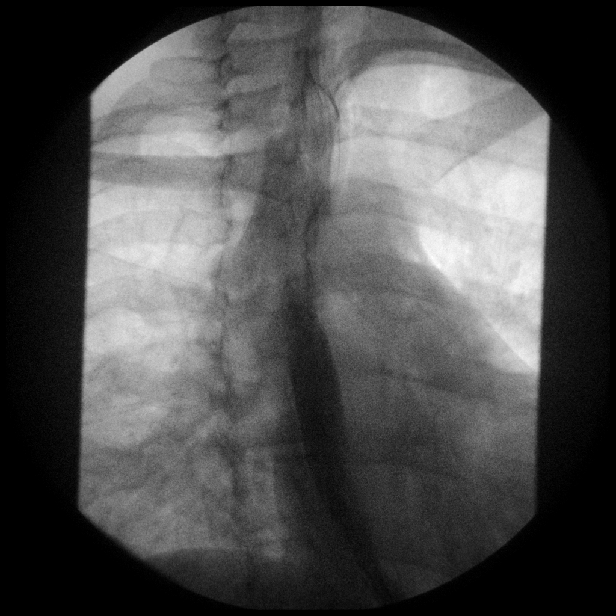

[Series 4: run · 1 of 1 slices shown (4 of 13)]
[im 1/1]
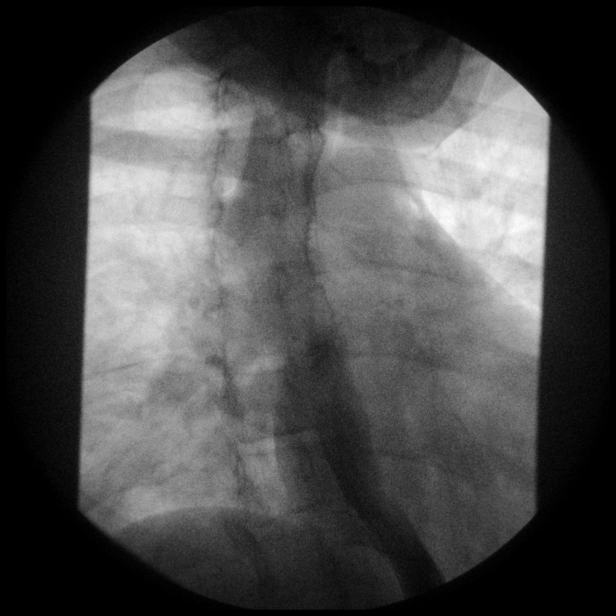

[Series 5: run · 1 of 1 slices shown (5 of 13)]
[im 1/1]
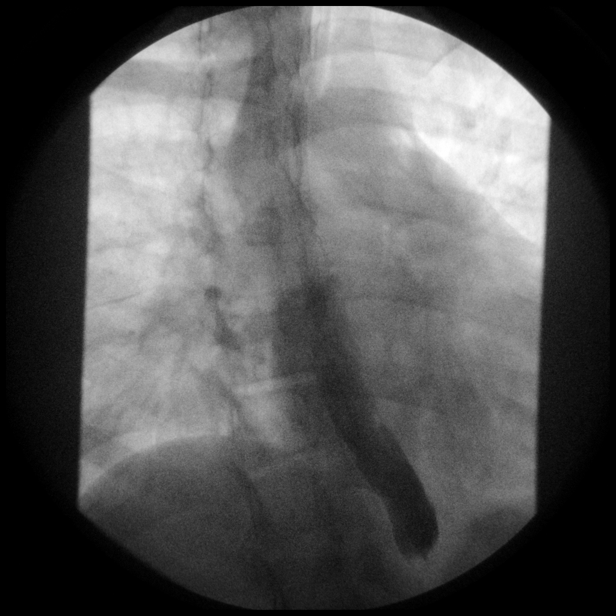

[Series 6: run · 1 of 1 slices shown (6 of 13)]
[im 1/1]
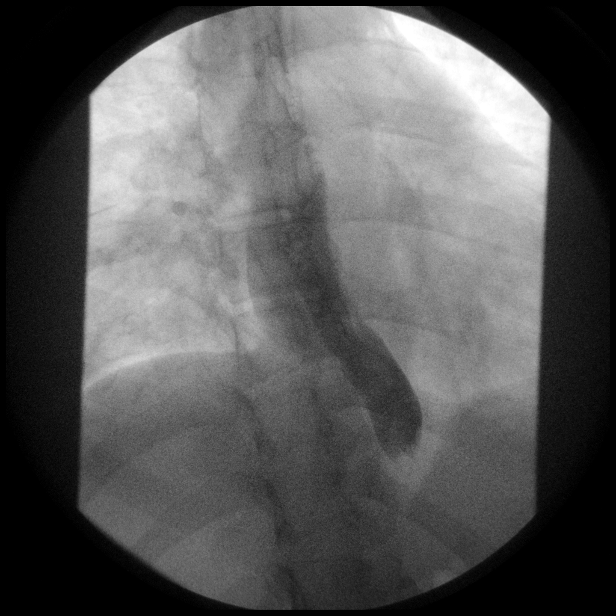

[Series 7: run · 1 of 1 slices shown (7 of 13)]
[im 1/1]
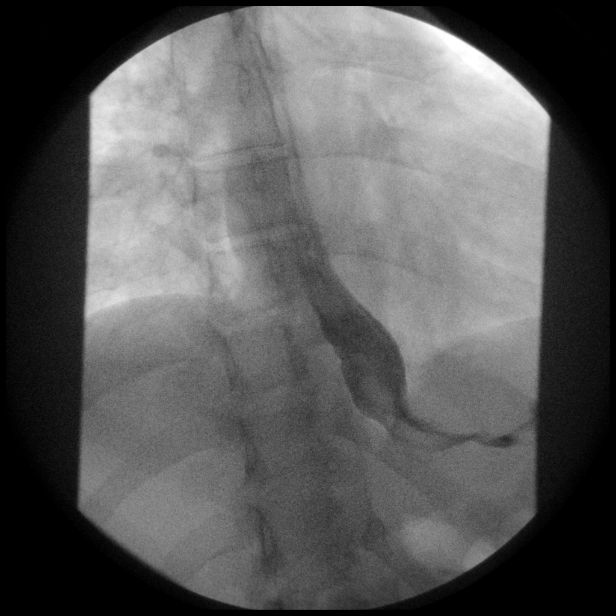

[Series 8: run · 1 of 1 slices shown (8 of 13)]
[im 1/1]
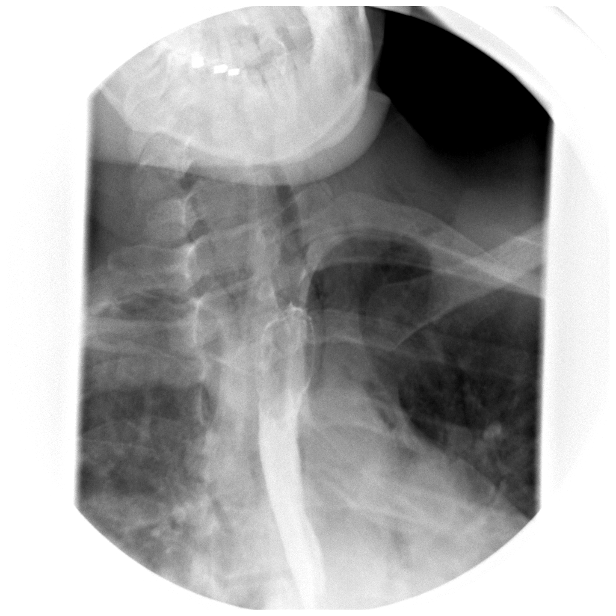

[Series 9: run · 1 of 1 slices shown (9 of 13)]
[im 1/1]
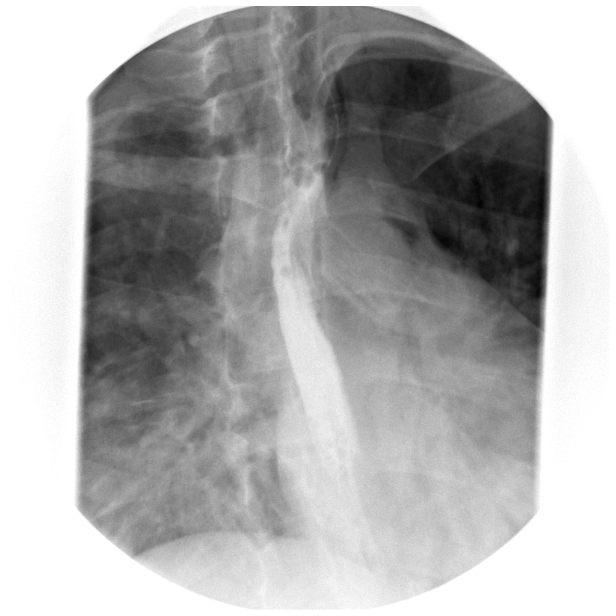

[Series 10: run · 1 of 1 slices shown (10 of 13)]
[im 1/1]
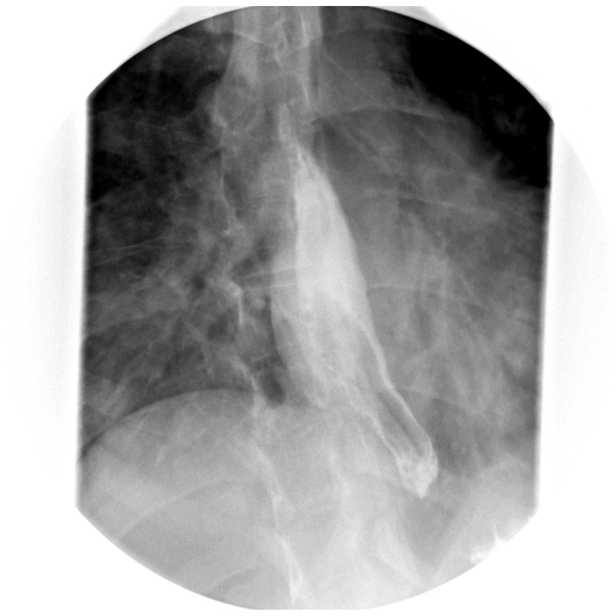

[Series 11: run · 1 of 1 slices shown (11 of 13)]
[im 1/1]
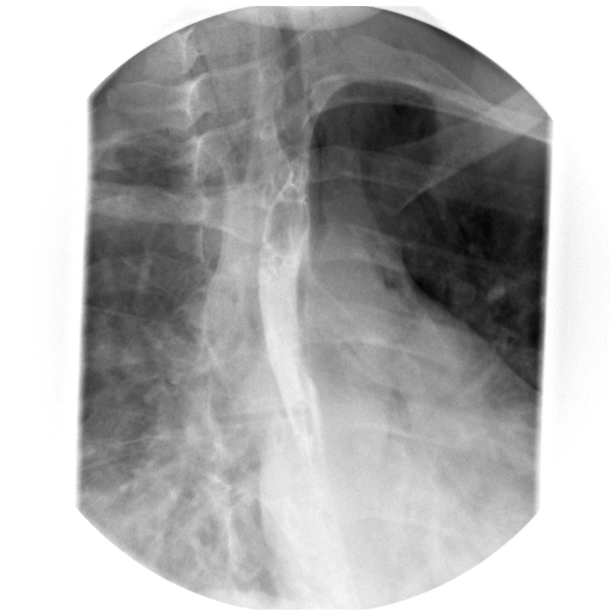

[Series 12: run · 1 of 1 slices shown (12 of 13)]
[im 1/1]
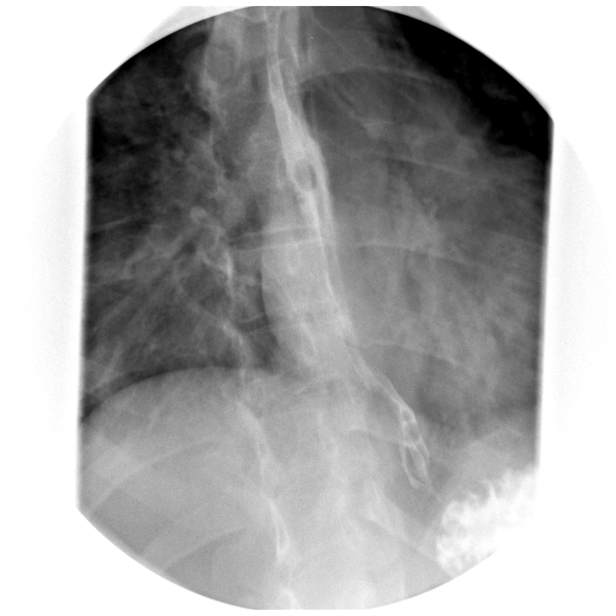

[Series 13: run · 1 of 1 slices shown (13 of 13)]
[im 1/1]
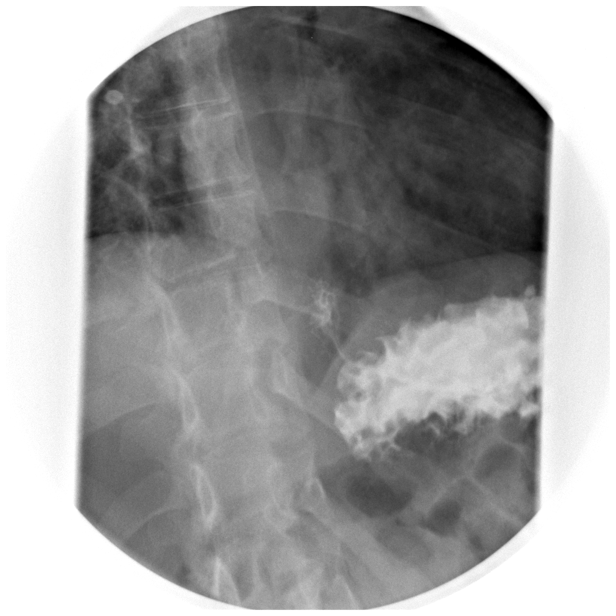

[13 of 13 positions shown; findings below may reference images not displayed]

FINDINGS: Limited single contrast esophagram demonstrates no esophageal
perforation. Esophagus is morphologically normal in appearance on
this single contrast examination. Specifically, no evidence of
esophageal mass, stricture, esophageal ring or hiatal hernia.
IMPRESSION: 1. Negative for esophageal perforation.

## 2018-11-25 ENCOUNTER — Emergency Department (HOSPITAL_COMMUNITY)
Admission: EM | Admit: 2018-11-25 | Discharge: 2018-11-26 | Disposition: A | Discharge status: Home / Self Care | Payer: Medicaid Other | Attending: Emergency Medicine | Admitting: Emergency Medicine

## 2018-11-25 ENCOUNTER — Other Ambulatory Visit: Payer: Self-pay

## 2018-11-25 ENCOUNTER — Encounter (HOSPITAL_COMMUNITY): Payer: Self-pay

## 2018-11-25 ENCOUNTER — Emergency Department (HOSPITAL_COMMUNITY): Payer: Medicaid Other

## 2018-11-25 DIAGNOSIS — Z79899 Other long term (current) drug therapy: Secondary | ICD-10-CM | POA: Insufficient documentation

## 2018-11-25 DIAGNOSIS — Z3A01 Less than 8 weeks gestation of pregnancy: Secondary | ICD-10-CM | POA: Insufficient documentation

## 2018-11-25 DIAGNOSIS — R8290 Unspecified abnormal findings in urine: Secondary | ICD-10-CM

## 2018-11-25 DIAGNOSIS — O210 Mild hyperemesis gravidarum: Secondary | ICD-10-CM

## 2018-11-25 LAB — HCG, QUANTITATIVE, PREGNANCY: hCG, Beta Chain, Quant, S: 110290 m[IU]/mL — ABNORMAL HIGH (ref <5)

## 2018-11-25 LAB — WET PREP, GENITAL
Sperm: NONE SEEN
Trich, Wet Prep: NONE SEEN
Yeast Wet Prep HPF POC: NONE SEEN

## 2018-11-25 LAB — URINALYSIS, ROUTINE W REFLEX MICROSCOPIC
Bilirubin Urine: NEGATIVE
Glucose, UA: NEGATIVE mg/dL
Hgb urine dipstick: NEGATIVE
Ketones, ur: NEGATIVE mg/dL
Nitrite: NEGATIVE
Protein, ur: NEGATIVE mg/dL
Specific Gravity, Urine: 1.027 (ref 1.005–1.030)
pH: 6 (ref 5.0–8.0)

## 2018-11-25 LAB — COMPREHENSIVE METABOLIC PANEL
ALT: 19 U/L (ref 0–44)
AST: 16 U/L (ref 15–41)
Albumin: 3.9 g/dL (ref 3.5–5.0)
Alkaline Phosphatase: 69 U/L (ref 38–126)
Anion gap: 8 (ref 5–15)
BUN: 8 mg/dL (ref 6–20)
CO2: 24 mmol/L (ref 22–32)
Calcium: 9.7 mg/dL (ref 8.9–10.3)
Chloride: 102 mmol/L (ref 98–111)
Creatinine, Ser: 0.8 mg/dL (ref 0.44–1.00)
GFR calc Af Amer: >60 mL/min (ref >60)
GFR calc non Af Amer: >60 mL/min (ref >60)
Glucose, Bld: 92 mg/dL (ref 70–99)
Potassium: 3.5 mmol/L (ref 3.5–5.1)
Sodium: 134 mmol/L — ABNORMAL LOW (ref 135–145)
Total Bilirubin: 0.7 mg/dL (ref 0.3–1.2)
Total Protein: 7.9 g/dL (ref 6.5–8.1)

## 2018-11-25 LAB — CBC
HCT: 41.9 % (ref 36.0–46.0)
Hemoglobin: 14.3 g/dL (ref 12.0–15.0)
MCH: 27.2 pg (ref 26.0–34.0)
MCHC: 34.1 g/dL (ref 30.0–36.0)
MCV: 79.7 fL — ABNORMAL LOW (ref 80.0–100.0)
Platelets: 377 10*3/uL (ref 150–400)
RBC: 5.26 MIL/uL — ABNORMAL HIGH (ref 3.87–5.11)
RDW: 14.9 % (ref 11.5–15.5)
WBC: 14.5 10*3/uL — ABNORMAL HIGH (ref 4.0–10.5)
nRBC: 0 % (ref 0.0–0.2)

## 2018-11-25 LAB — I-STAT BETA HCG BLOOD, ED (MC, WL, AP ONLY): I-stat hCG, quantitative: >2000 m[IU]/mL — ABNORMAL HIGH (ref <5)

## 2018-11-25 LAB — LIPASE, BLOOD: Lipase: 28 U/L (ref 11–51)

## 2018-11-25 MED ORDER — ONDANSETRON HCL 4 MG/2ML IJ SOLN
4.0000 mg | Freq: Once | INTRAMUSCULAR | Status: AC
  Administered 2018-11-25: 21:00:00 4 mg via INTRAVENOUS
  Filled 2018-11-25: qty 2

## 2018-11-25 MED ORDER — METOCLOPRAMIDE HCL 5 MG/ML IJ SOLN
10.0000 mg | Freq: Once | INTRAMUSCULAR | Status: AC
  Administered 2018-11-25: 10 mg via INTRAVENOUS
  Filled 2018-11-25: qty 2

## 2018-11-25 MED ORDER — CEFTRIAXONE SODIUM 250 MG IJ SOLR
250.0000 mg | Freq: Once | INTRAMUSCULAR | Status: AC
  Administered 2018-11-25: 22:00:00 250 mg via INTRAMUSCULAR
  Filled 2018-11-25: qty 250

## 2018-11-25 MED ORDER — AZITHROMYCIN 250 MG PO TABS
1000.0000 mg | ORAL_TABLET | Freq: Once | ORAL | Status: AC
  Administered 2018-11-25: 1000 mg via ORAL
  Filled 2018-11-25: qty 4

## 2018-11-25 MED ORDER — SODIUM CHLORIDE 0.9% FLUSH
3.0000 mL | Freq: Once | INTRAVENOUS | Status: AC
  Administered 2018-11-25: 3 mL via INTRAVENOUS

## 2018-11-25 MED ORDER — STERILE WATER FOR INJECTION IJ SOLN
INTRAMUSCULAR | Status: AC
  Administered 2018-11-25: 1.2 mL
  Filled 2018-11-25: qty 10

## 2018-11-25 NOTE — ED Notes (Signed)
Pelvic set up

## 2018-11-25 NOTE — ED Triage Notes (Signed)
Pt reports RLQ pain for the last 3 weeks. She endorses vomiting after eating and diarrhea. Also reports clear vaginal discharge.

## 2018-11-25 NOTE — ED Provider Notes (Signed)
"28 year old female with history of cocaine abuse presenting for evaluation of abdominal pain.  Patient report for the past month she has had recurrent lower abdominal pain.  She described as a sharp achy sensation across her abdomen but for the past few days it seems to be radiating down towards her vaginal region.  Pain is worsening with movement and sometimes with urination.  She also endorsed associated nausea vomiting and diarrhea.  She does not complain of any fever or chills and no URI symptoms.  She denies any burning sensation when urinate, vaginal bleeding or vaginal discharge.  She denies any pain with sexual activities.  She mention her menstrual period has been irregular and shorter than usual with last episodes a week ago.  She feels more tired than usual and sleeping more than usual.  She denies any specific treatment tried at home except for occasional Benadryl to help with sleep.  She endorsed persistent nausea and decreased appetite."  Physical Exam  BP 110/76 (BP Location: Left Arm)   Pulse 67   Temp 98.3 F (36.8 C) (Oral)   Resp 18   Ht 5\' 3"  (1.6 m)   Wt 77.1 kg   SpO2 95%   BMI 30.11 kg/m   Physical Exam Vitals signs and nursing note reviewed.  Constitutional:      General: She is not in acute distress. HENT:     Head: Normocephalic.     Mouth/Throat:     Mouth: Mucous membranes are moist.  Eyes:     Conjunctiva/sclera: Conjunctivae normal.  Neck:     Musculoskeletal: Neck supple.  Cardiovascular:     Rate and Rhythm: Normal rate and regular rhythm.     Heart sounds: No murmur. No friction rub. No gallop.   Pulmonary:     Effort: Pulmonary effort is normal. No respiratory distress.  Abdominal:     General: There is no distension.     Palpations: Abdomen is soft. There is no mass.     Tenderness: There is no abdominal tenderness. There is no right CVA tenderness, left CVA tenderness, guarding or rebound.     Hernia: No hernia is present.     Comments: Abdomen  is soft, nondistended.  Skin:    General: Skin is warm.     Capillary Refill: Capillary refill takes less than 2 seconds.     Findings: No rash.  Neurological:     Mental Status: She is alert.  Psychiatric:        Behavior: Behavior normal.     ED Course/Procedures     Procedures  MDM   28 year old female with history of cocaine abuse received at signout from [facility] pending resolution of vomiting.  Please see his note for further medical decision making and work-up.  Pregnancy test was positive today.  Pelvic ultrasound demonstrated viable intrauterine pregnancy.  UA with asymptomatic bacteriuria.  Will discharge home with Keflex.  CBC with leukocytosis of 14.5, but suspect hemoconcentration.  She was given fluids by PA Laveda Norman.  Patient began vomiting after receiving azithromycin.  She was given Reglan and on reevaluation vomiting had subsided.  She successfully tolerated oral fluids.  She reports that she is feeling much better would like to go home.  She is requesting a referral to an OB/GYN, which has been given.  Suspect hyperemesis gravidarum.  At this time, I feel that the patient is hemodynamically stable and in no acute distress.  Safe for discharge to home with OB/GYN follow-up.  Barkley BoardsMcDonald, Candela Krul A, PA-C 11/26/18 0535    Nira Connardama, Pedro Eduardo, MD 11/27/18 [phone]

## 2018-11-25 NOTE — ED Notes (Signed)
Requested patient to urinate. 

## 2018-11-25 NOTE — Discharge Instructions (Addendum)
You are pregnant, estimated to be 8 weeks and 2 days with an estimated delivery date of Dec 16th.    Your nausea is likely due to your pregnancy.   You can take 1 tablet of Reglan every 6 hours as needed for nausea and vomiting.  You can also try using Unisom and vitamin B6, both of which are available over-the-counter, for nausea and vomiting. You can take 25 mg of the Unisom Sleeptabs once before bed and 10-25 mg of vitamin B-6 three times per day, once every 6-8 hours.   Your urine did show some bacteria.  Take 1 tablet of Keflex every 6 hours for the next 5 days for this.  Call to schedule a follow-up appointment with an OB/GYN.  For pain, you can take Tylenol at home as directed on the label.  You should avoid all NSAIDs since she had a positive pregnancy test, which includes medication such as ibuprofen, Advil, or Aleve.  Return to the emergency department if you develop persistent vomiting despite taking Reglan, if you develop high fever, severe abdominal pain, vaginal bleeding, or other new, concerning symptoms.

## 2018-11-25 NOTE — ED Notes (Signed)
Patient states that she feels like her vagina is about to fall off. Patient states she had sex with her boyfriend a few hours ago. Patient states she has been nauseas, vomiting, and diarhea.

## 2018-11-25 NOTE — ED Provider Notes (Signed)
[facility]-EMERGENCY DEPT Provider Note   CSN: [id number] Arrival date & time: 11/25/18  1932    History   Chief Complaint Chief Complaint  Patient presents with  . Abdominal Pain    HPI Nancy Proctor is a 28 y.o. female.     The history is provided by the patient. No language interpreter was used.  Abdominal Pain     28 year old female with history of cocaine abuse presenting for evaluation of abdominal pain.  Patient report for the past month she has had recurrent lower abdominal pain.  She described as a sharp achy sensation across her abdomen but for the past few days it seems to be radiating down towards her vaginal region.  Pain is worsening with movement and sometimes with urination.  She also endorsed associated nausea vomiting and diarrhea.  She does not complain of any fever or chills and no URI symptoms.  She denies any burning sensation when urinate, vaginal bleeding or vaginal discharge.  She denies any pain with sexual activities.  She mention her menstrual period has been irregular and shorter than usual with last episodes a week ago.  She feels more tired than usual and sleeping more than usual.  She denies any specific treatment tried at home except for occasional Benadryl to help with sleep.  She endorsed persistent nausea and decreased appetite.  Past Medical History:  Diagnosis Date  . Anemia   . Cocaine abuse [facility](HCC)     Patient Active Problem List   Diagnosis Date Noted  . Pneumomediastinum (HCC) 06/11/2015  . Hallucination 06/11/2015  . CAP (community acquired pneumonia) 06/11/2015  . Sepsis (HCC) 06/11/2015  . Hypokalemia 06/11/2015  . Aspiration pneumonia (HCC) 06/11/2015  . Acute psychosis (HCC)   . Cocaine abuse (HCC)   . Psychoses (HCC)     History reviewed. No pertinent surgical history.   OB History   No obstetric history on file.      Home Medications    Prior to Admission medications   Medication Sig Start Date  End Date Taking? Authorizing Provider  acetaminophen (TYLENOL) 325 MG tablet Take 2 tablets (650 mg total) by mouth every 4 (four) hours as needed for mild pain or fever (T > 38.3Celsius). 06/15/15   Marinda ElkFeliz Ortiz, Abraham, MD  Emollient (EUCERIN) lotion Apply topically as needed. 03/16/16   Sam, Ace GinsSerena Y, PA-C  ibuprofen (ADVIL,MOTRIN) 600 MG tablet Take 1 tablet (600 mg total) by mouth every 6 (six) hours as needed. 03/16/16   Sam, Ace GinsSerena Y, PA-C  levofloxacin (LEVAQUIN) 750 MG tablet Take 1 tablet (750 mg total) by mouth daily. 06/15/15   Marinda ElkFeliz Ortiz, Abraham, MD    Family History Family History  Problem Relation Age of Onset  . Cancer Mother   . Hypertension Father   . Diabetes Other   . Heart attack Other     Social History Social History   Tobacco Use  . Smoking status: Never Smoker  . Smokeless tobacco: Never Used  Substance Use Topics  . Alcohol use: Yes    Comment: occ  . Drug use: Yes    Types: Cocaine     Allergies   Peanut butter flavor and Soy allergy   Review of Systems Review of Systems  Gastrointestinal: Positive for abdominal pain.  All other systems reviewed and are negative.    Physical Exam Updated Vital Signs BP 132/80 (BP Location: Left Arm)   Pulse 73   Temp 98.6 F (37 C) (Oral)  Resp 18   Ht  (1.6 m)   Wt 77.1 kg   SpO2 100%   BMI 30.11 kg/m   Physical Exam Vitals signs and nursing note reviewed.  Constitutional:      General: She is not in acute distress.    Appearance: She is well-developed. She is obese.  HENT:     Head: Atraumatic.  Eyes:     Conjunctiva/sclera: Conjunctivae normal.  Neck:     Musculoskeletal: Neck supple.  Cardiovascular:     Rate and Rhythm: Normal rate and regular rhythm.  Pulmonary:     Effort: Pulmonary effort is normal.     Breath sounds: Normal breath sounds.  Abdominal:     General: Abdomen is flat. Bowel sounds are normal.     Palpations: Abdomen is soft.     Tenderness: There is  generalized abdominal tenderness. There is no right CVA tenderness or left CVA tenderness. Negative signs include Murphy's sign and McBurney's sign.     Hernia: No hernia is present.  Genitourinary:    Comments: Chaperone present during exam.  No inguinal lymphadenopathy or inguinal hernia noted.  Normal external genitalia.  Mild discomfort with speculum insertion.  Functional discharge noted in vaginal vault.  Closed cervical os free of lesion or rash.  On bimanual examination, bilateral adnexal tenderness with cervical motion tenderness. Skin:    Findings: No rash.  Neurological:     Mental Status: She is alert.      ED Treatments / Results  Labs (all labs ordered are listed, but only abnormal results are displayed) Labs Reviewed  WET PREP, GENITAL - Abnormal; Notable for the following components:      Result Value   Clue Cells Wet Prep HPF POC PRESENT (*)    WBC, Wet Prep HPF POC MANY (*)    All other components within normal limits  COMPREHENSIVE METABOLIC PANEL - Abnormal; Notable for the following components:   Sodium 134 (*)    All other components within normal limits  CBC - Abnormal; Notable for the following components:   WBC 14.5 (*)    RBC 5.26 (*)    MCV 79.7 (*)    All other components within normal limits  URINALYSIS, ROUTINE W REFLEX MICROSCOPIC - Abnormal; Notable for the following components:   APPearance HAZY (*)    Leukocytes,Ua MODERATE (*)    Bacteria, UA RARE (*)    All other components within normal limits  HCG, QUANTITATIVE, PREGNANCY - Abnormal; Notable for the following components:   hCG, Beta Chain, Quant, S 110,290 (*)    All other components within normal limits  I-STAT BETA HCG BLOOD, ED ([facility], [facility], [facility] ONLY) - Abnormal; Notable for the following components:   I-stat hCG, quantitative >2,000.0 (*)    All other components within normal limits  LIPASE, BLOOD  RPR  HIV ANTIBODY (ROUTINE TESTING W REFLEX)  GC/CHLAMYDIA PROBE AMP ([place]) NOT AT  [facility]    EKG None  Radiology US Ob Comp < 14 Wks  Result Date: 11/25/2018 CLINICAL DATA:  Initial evaluation for acute pelvic pain, early pregnancy. EXAM: OBSTETRIC <14 WK [place] AND TRANSVAGINAL OB US TECHNIQUE: Both transabdominal and transvaginal ultrasound examinations were performed for complete evaluation of the gestation as well as the maternal uterus, adnexal regions, and pelvic cul-de-sac. Transvaginal technique was performed to assess early pregnancy. COMPARISON:  None. FINDINGS: Intrauterine gestational sac: Single Yolk sac:  Present Embryo:  Present Cardiac Activity: Present Heart Rate: 165 bpm CRL: 17.5  mm   8 w   2 d                  [place] EDC: 07/05/2019 Subchorionic hemorrhage:  None visualized. Maternal uterus/adnexae: Ovaries are normal in appearance bilaterally. No adnexal mass or free fluid. IMPRESSION: 1. Single viable intrauterine pregnancy as above without complication, estimated gestational age [redacted] weeks and 2 days by crown-rump length, with ultrasound EDC of 07/05/2019. 2. No other acute maternal uterine or adnexal abnormality identified. Electronically Signed   By: Rise Mu M.D.   On: 11/25/2018 22:35   US Ob Transvaginal  Result Date: 11/25/2018 CLINICAL DATA:  Initial evaluation for acute pelvic pain, early pregnancy. EXAM: OBSTETRIC <14 WK [place] AND TRANSVAGINAL OB US TECHNIQUE: Both transabdominal and transvaginal ultrasound examinations were performed for complete evaluation of the gestation as well as the maternal uterus, adnexal regions, and pelvic cul-de-sac. Transvaginal technique was performed to assess early pregnancy. COMPARISON:  None. FINDINGS: Intrauterine gestational sac: Single Yolk sac:  Present Embryo:  Present Cardiac Activity: Present Heart Rate: 165 bpm CRL: 17.5 mm   8 w   2 d                  [place] EDC: 07/05/2019 Subchorionic hemorrhage:  None visualized. Maternal uterus/adnexae: Ovaries are normal in appearance bilaterally. No adnexal mass or free fluid.  IMPRESSION: 1. Single viable intrauterine pregnancy as above without complication, estimated gestational age [redacted] weeks and 2 days by crown-rump length, with ultrasound EDC of 07/05/2019. 2. No other acute maternal uterine or adnexal abnormality identified. Electronically Signed   By: Rise Mu M.D.   On: 11/25/2018 22:35    Procedures Procedures (including critical care time)  Medications Ordered in ED Medications  metoCLOPramide (REGLAN) injection 10 mg (has no administration in time range)  sodium chloride flush (NS) 0.9 % injection 3 mL (3 mLs Intravenous Given 11/25/18 2031)  ondansetron (ZOFRAN) injection 4 mg (4 mg Intravenous Given 11/25/18 2030)  cefTRIAXone (ROCEPHIN) injection 250 mg (250 mg Intramuscular Given 11/25/18 2210)  azithromycin (ZITHROMAX) tablet 1,000 mg (1,000 mg Oral Given 11/25/18 2211)  sterile water (preservative free) injection (1.2 mLs  Given 11/25/18 2210)     Initial Impression / Assessment and Plan / ED Course  I have reviewed the triage vital signs and the nursing notes.  Pertinent labs & imaging results that were available during my care of the patient were reviewed by me and considered in my medical decision making (see chart for details).        BP 118/85   Pulse 78   Temp 98.6 F (37 C) (Oral)   Resp (!) 23   Ht 5\' 3"  (1.6 m)   Wt 77.1 kg   SpO2 100%   BMI 30.11 kg/m    Final Clinical Impressions(s) / ED Diagnoses   Final diagnoses:  Hyperemesis gravidarum, mild, before 23rd week  Asymptomatic urinary finding    ED Discharge Orders    None     8:39 PM Patient here with recurrent abdominal pain with associate nausea vomiting diarrhea.  Symptom has been ongoing for nearly a month.  Currently her labs remarkable for elevated hCG concerning for pregnancy.  Will perform pelvic examination.   9:54 PM Wet prep showing evidence of clue cells and many WBC.  Since pt has pelvic pain on pelvic exam, will give rocephin/zithromax to cover  for potential STI.    10:14 PM Quantitative hCG is 110,290 which puts her at 6-12 weeks.  11:34 PM US showing single viable IUP without complication estimated gestational age [redacted] weeks and 2 days.  At this time, pt endorse persistent nausea.  She has had  of Zofran.  Will give Reglan.  I also encourage pt to sniff alcohol swab as it may help.  Will continue with IV hydration.  Plan to discharge pt home with Keflex for asymptomatic UTI in the setting of pregnancy.  However, if pt unable to tolerates PO, may benefit from admission for further management of hyperemesis gravidarum.   11:52 PM Pt sign out to Glenard Haring who will continue managing pt's condition, and will determine disposition.    Fayrene Helper, PA-C 11/25/18 Arletha Grippe    Raeford Razor, MD 11/26/18 [phone]

## 2018-11-26 LAB — RPR: RPR Ser Ql: NONREACTIVE

## 2018-11-26 LAB — HIV ANTIBODY (ROUTINE TESTING W REFLEX): HIV Screen 4th Generation wRfx: NONREACTIVE

## 2018-11-26 MED ORDER — METOCLOPRAMIDE HCL 10 MG PO TABS: 10.0000 mg | ORAL_TABLET | Freq: Four times a day (QID) | ORAL | 0 refills | Status: AC

## 2018-11-26 MED ORDER — CEPHALEXIN 250 MG PO CAPS: 250.0000 mg | ORAL_CAPSULE | Freq: Four times a day (QID) | ORAL | 0 refills | Status: AC

## 2018-11-28 LAB — GC/CHLAMYDIA PROBE AMP (~~LOC~~) NOT AT ARMC
Chlamydia: NEGATIVE
Neisseria Gonorrhea: NEGATIVE

## 2018-11-30 ENCOUNTER — Emergency Department (HOSPITAL_COMMUNITY)
Admission: EM | Admit: 2018-11-30 | Discharge: 2018-11-30 | Disposition: A | Discharge status: Home / Self Care | Payer: Medicaid Other | Attending: Emergency Medicine | Admitting: Emergency Medicine

## 2018-11-30 ENCOUNTER — Encounter (HOSPITAL_COMMUNITY): Payer: Self-pay | Admitting: Obstetrics and Gynecology

## 2018-11-30 ENCOUNTER — Other Ambulatory Visit: Payer: Self-pay

## 2018-11-30 DIAGNOSIS — O200 Threatened abortion: Secondary | ICD-10-CM | POA: Insufficient documentation

## 2018-11-30 DIAGNOSIS — O209 Hemorrhage in early pregnancy, unspecified: Secondary | ICD-10-CM | POA: Insufficient documentation

## 2018-11-30 LAB — BASIC METABOLIC PANEL
Anion gap: 9 (ref 5–15)
BUN: 11 mg/dL (ref 6–20)
CO2: 21 mmol/L — ABNORMAL LOW (ref 22–32)
Calcium: 8.5 mg/dL — ABNORMAL LOW (ref 8.9–10.3)
Chloride: 105 mmol/L (ref 98–111)
Creatinine, Ser: 0.73 mg/dL (ref 0.44–1.00)
GFR calc Af Amer: >60 mL/min (ref >60)
GFR calc non Af Amer: >60 mL/min (ref >60)
Glucose, Bld: 84 mg/dL (ref 70–99)
Potassium: 3.3 mmol/L — ABNORMAL LOW (ref 3.5–5.1)
Sodium: 135 mmol/L (ref 135–145)

## 2018-11-30 LAB — CBC
HCT: 39.6 % (ref 36.0–46.0)
Hemoglobin: 13.6 g/dL (ref 12.0–15.0)
MCH: 27 pg (ref 26.0–34.0)
MCHC: 34.3 g/dL (ref 30.0–36.0)
MCV: 78.6 fL — ABNORMAL LOW (ref 80.0–100.0)
Platelets: 424 10*3/uL — ABNORMAL HIGH (ref 150–400)
RBC: 5.04 MIL/uL (ref 3.87–5.11)
RDW: 14.6 % (ref 11.5–15.5)
WBC: 13.8 10*3/uL — ABNORMAL HIGH (ref 4.0–10.5)
nRBC: 0 % (ref 0.0–0.2)

## 2018-11-30 LAB — URINALYSIS, ROUTINE W REFLEX MICROSCOPIC
Bilirubin Urine: NEGATIVE
Glucose, UA: NEGATIVE mg/dL
Ketones, ur: 80 mg/dL — AB
Nitrite: NEGATIVE
Protein, ur: 100 mg/dL — AB
RBC / HPF: >50 RBC/hpf — ABNORMAL HIGH (ref 0–5)
Specific Gravity, Urine: 1.033 — ABNORMAL HIGH (ref 1.005–1.030)
pH: 5 (ref 5.0–8.0)

## 2018-11-30 LAB — HCG, QUANTITATIVE, PREGNANCY: hCG, Beta Chain, Quant, S: 153426 m[IU]/mL — ABNORMAL HIGH (ref <5)

## 2018-11-30 MED ORDER — ONDANSETRON HCL 4 MG/2ML IJ SOLN
4.0000 mg | Freq: Once | INTRAMUSCULAR | Status: AC
  Administered 2018-11-30: 4 mg via INTRAVENOUS
  Filled 2018-11-30: qty 2

## 2018-11-30 MED ORDER — SODIUM CHLORIDE 0.9 % IV BOLUS
1000.0000 mL | Freq: Once | INTRAVENOUS | Status: AC
  Administered 2018-11-30: 1000 mL via INTRAVENOUS

## 2018-11-30 NOTE — ED Notes (Signed)
Pelvic supplies at bedside. 

## 2018-11-30 NOTE — ED Provider Notes (Signed)
[facility]-EMERGENCY DEPT Provider Note   CSN: [id number] Arrival date & time: 11/30/18  1447    History   Chief Complaint Chief Complaint  Patient presents with  . Vaginal Bleeding    HPI Nancy Proctor is a 28 y.o. female.     Pt presents to the ED today with vaginal bleeding.  Pt presented to the ED originally on May 8.  She had an [place] then which showed an 8 week IUP.  The pt was not having any bleeding then.  She developed the bleeding this morning.  She is O+.  The pt is also having n/v.  She denies any pain.  She had a negative HIV, GC, Chl on that date.     Past Medical History:  Diagnosis Date  . Anemia   . Cocaine abuse [facility])     Patient Active Problem List   Diagnosis Date Noted  . Pneumomediastinum (HCC) 06/11/2015  . Hallucination 06/11/2015  . CAP (community acquired pneumonia) 06/11/2015  . Sepsis (HCC) 06/11/2015  . Hypokalemia 06/11/2015  . Aspiration pneumonia (HCC) 06/11/2015  . Acute psychosis (HCC)   . Cocaine abuse (HCC)   . Psychoses (HCC)     No past surgical history on file.   OB History    Gravida  1   Para      Term      Preterm      AB      Living        SAB      TAB      Ectopic      Multiple      Live Births               Home Medications    Prior to Admission medications   Medication Sig Start Date End Date Taking? Authorizing Provider  cephALEXin (KEFLEX) 250 MG capsule Take 1 capsule (250 mg total) by mouth 4 (four) times daily for 5 days. 11/26/18 12/01/18 Yes McDonald, Mia A, PA-C  metoCLOPramide (REGLAN) 10 MG tablet Take 1 tablet (10 mg total) by mouth every 6 (six) hours. 11/26/18  Yes McDonald, Mia A, PA-C    Family History Family History  Problem Relation Age of Onset  . Cancer Mother   . Hypertension Father   . Diabetes Other   . Heart attack Other     Social History Social History   Tobacco Use  . Smoking status: Never Smoker  . Smokeless tobacco: Never Used   Substance Use Topics  . Alcohol use: Yes    Comment: occ  . Drug use: Yes    Types: Cocaine     Allergies   Peanut butter flavor and Soy allergy   Review of Systems Review of Systems  Gastrointestinal: Positive for nausea and vomiting.  Genitourinary: Positive for vaginal bleeding.  All other systems reviewed and are negative.    Physical Exam Updated Vital Signs BP 127/74   Pulse 70   Temp 98.9 F (37.2 C) (Oral)   Resp 18   LMP 09/30/2018 (Approximate)   SpO2 99%   Physical Exam Vitals signs and nursing note reviewed.  Constitutional:      Appearance: Normal appearance.  HENT:     Head: Normocephalic and atraumatic.     Right Ear: External ear normal.     Left Ear: External ear normal.     Nose: Nose normal.     Mouth/Throat:     Mouth: Mucous membranes are dry.  Eyes:     Extraocular Movements: Extraocular movements intact.     Conjunctiva/sclera: Conjunctivae normal.     Pupils: Pupils are equal, round, and reactive to light.  Neck:     Musculoskeletal: Normal range of motion and neck supple.  Cardiovascular:     Rate and Rhythm: Regular rhythm. Tachycardia present.     Pulses: Normal pulses.     Heart sounds: Normal heart sounds.  Pulmonary:     Effort: Pulmonary effort is normal.     Breath sounds: Normal breath sounds.  Abdominal:     General: Abdomen is flat. Bowel sounds are normal.     Palpations: Abdomen is soft.  Genitourinary:    Vagina: Bleeding present.     Cervix: Normal.     Adnexa: Right adnexa normal and left adnexa normal.  Musculoskeletal: Normal range of motion.  Skin:    General: Skin is warm.     Capillary Refill: Capillary refill takes less than 2 seconds.  Neurological:     General: No focal deficit present.     Mental Status: She is alert and oriented to person, place, and time.  Psychiatric:        Mood and Affect: Mood normal.        Behavior: Behavior normal.      ED Treatments / Results  Labs (all labs  ordered are listed, but only abnormal results are displayed) Labs Reviewed  URINALYSIS, ROUTINE W REFLEX MICROSCOPIC - Abnormal; Notable for the following components:      Result Value   Color, Urine AMBER (*)    APPearance CLOUDY (*)    Specific Gravity, Urine 1.033 (*)    Hgb urine dipstick LARGE (*)    Ketones, ur 80 (*)    Protein, ur 100 (*)    Leukocytes,Ua TRACE (*)    RBC / HPF >50 (*)    Bacteria, UA FEW (*)    All other components within normal limits  CBC - Abnormal; Notable for the following components:   WBC 13.8 (*)    MCV 78.6 (*)    Platelets 424 (*)    All other components within normal limits  BASIC METABOLIC PANEL - Abnormal; Notable for the following components:   Potassium 3.3 (*)    CO2 21 (*)    Calcium 8.5 (*)    All other components within normal limits  HCG, QUANTITATIVE, PREGNANCY    EKG None  Radiology No results found.  Procedures Procedures (including critical care time)  Medications Ordered in ED Medications  sodium chloride 0.9 % bolus 1,000 mL (1,000 mLs Intravenous Bolus from Bag 11/30/18 1528)  ondansetron (ZOFRAN) injection 4 mg (4 mg Intravenous Given 11/30/18 1528)     Initial Impression / Assessment and Plan / ED Course  I have reviewed the triage vital signs and the nursing notes.  Pertinent labs & imaging results that were available during my care of the patient were reviewed by me and considered in my medical decision making (see chart for details).  Clinical Course as of Nov 29 1645  Wed Nov 30, 2018  1644 Vag bleed has US with confirmed IUP, fu quant, does not need gam   [MB]    Clinical Course User Index [MB] Pilar PlateBero, Elmer SowMichael M, MD      Quant pending.  US with confirmed IUP a few days ago.  Blood type O+, so she does not need rhogam.  Pt signed out to Dr. Pilar PlateBero at shift change.  Final  Clinical Impressions(s) / ED Diagnoses   Final diagnoses:  Threatened miscarriage    ED Discharge Orders    None        Jacalyn Lefevre, MD 11/30/18 [phone]

## 2018-11-30 NOTE — Discharge Instructions (Addendum)
You were evaluated in the Emergency Department and after careful evaluation, we did not find any emergent condition requiring admission or further testing in the hospital.  Your symptoms may be due to a miscarriage.  Your hormone levels in your blood were inconclusive, meaning they could not tell us exactly what was going on.  It is important that you follow-up with a OB doctor for further management.  Please return to the Emergency Department if you experience any worsening of your condition.  We encourage you to follow up with a primary care provider.  Thank you for allowing [place] to be a part of your care.

## 2018-11-30 NOTE — ED Triage Notes (Signed)
Pt reports she found out on Friday that she was pregnant and she is worried she is having an abortion. Pt reports she has been having large amounts of emesis and this morning noticed she had blood and "possible tissue" when she attempted to urinate.

## 2018-11-30 NOTE — ED Provider Notes (Signed)
Assumed care from Dr. Shirlee Limerick.  Patient's hCG is 150,000, increased from about 100,000 5 days ago.  Patient is now [redacted] weeks pregnant.  According to the literature, at 8 weeks the hCG level begins to plateau.  Therefore it is difficult to determine if these 2 values are normal or abnormal.  Patient would benefit from OB/GYN follow-up.  Discussed with patient the possibility of this being normal bleeding related to early pregnancy or also the possibility of a miscarriage.  Patient expresses understanding and will call the GYN/OB office for follow-up.  Discussed with patient how a repeat ultrasound today would not change our management.  After the discussed management above, the patient was determined to be safe for discharge.  The patient was in agreement with this plan and all questions regarding their care were answered.  ED return precautions were discussed and the patient will return to the ED with any significant worsening of condition.   Sabas Sous, MD 11/30/18 [phone]

## 2019-04-20 DEATH — deceased
